# Patient Record
Sex: Male | Born: 1950 | Race: Black or African American | Hispanic: No | Smoking: Heavy tobacco smoker
Health system: Southern US, Community
[De-identification: ages and names within clinical notes are randomized; demographics above are authoritative.]

---

## 2001-12-28 ENCOUNTER — Emergency Department (HOSPITAL_COMMUNITY): Admission: EM | Admit: 2001-12-28 | Discharge: 2001-12-28 | Payer: Self-pay | Admitting: Emergency Medicine

## 2001-12-28 ENCOUNTER — Encounter: Payer: Self-pay | Admitting: Emergency Medicine

## 2002-11-28 ENCOUNTER — Encounter: Payer: Self-pay | Admitting: Emergency Medicine

## 2002-11-28 ENCOUNTER — Emergency Department (HOSPITAL_COMMUNITY): Admission: EM | Admit: 2002-11-28 | Discharge: 2002-11-28 | Payer: Self-pay | Admitting: Emergency Medicine

## 2010-11-22 ENCOUNTER — Emergency Department (HOSPITAL_COMMUNITY)
Admission: EM | Admit: 2010-11-22 | Discharge: 2010-11-22 | Discharge status: Against Medical Advice | Payer: Self-pay | Attending: Emergency Medicine | Admitting: Emergency Medicine

## 2010-11-22 DIAGNOSIS — S0993XA Unspecified injury of face, initial encounter: Secondary | ICD-10-CM | POA: Insufficient documentation

## 2018-09-17 ENCOUNTER — Inpatient Hospital Stay (HOSPITAL_COMMUNITY)
Admission: EM | Admit: 2018-09-17 | Discharge: 2018-10-27 | DRG: 189 | Disposition: E | Payer: Medicare Other | Attending: Internal Medicine | Admitting: Internal Medicine

## 2018-09-17 ENCOUNTER — Other Ambulatory Visit: Payer: Self-pay

## 2018-09-17 ENCOUNTER — Emergency Department (HOSPITAL_COMMUNITY): Payer: Medicare Other

## 2018-09-17 ENCOUNTER — Encounter (HOSPITAL_COMMUNITY): Payer: Self-pay | Admitting: Emergency Medicine

## 2018-09-17 DIAGNOSIS — R64 Cachexia: Secondary | ICD-10-CM | POA: Diagnosis not present

## 2018-09-17 DIAGNOSIS — F17210 Nicotine dependence, cigarettes, uncomplicated: Secondary | ICD-10-CM | POA: Diagnosis present

## 2018-09-17 DIAGNOSIS — R918 Other nonspecific abnormal finding of lung field: Secondary | ICD-10-CM | POA: Diagnosis present

## 2018-09-17 DIAGNOSIS — I493 Ventricular premature depolarization: Secondary | ICD-10-CM | POA: Diagnosis present

## 2018-09-17 DIAGNOSIS — F1010 Alcohol abuse, uncomplicated: Secondary | ICD-10-CM | POA: Diagnosis present

## 2018-09-17 DIAGNOSIS — E43 Unspecified severe protein-calorie malnutrition: Secondary | ICD-10-CM | POA: Diagnosis present

## 2018-09-17 DIAGNOSIS — Z66 Do not resuscitate: Secondary | ICD-10-CM | POA: Diagnosis present

## 2018-09-17 DIAGNOSIS — L89220 Pressure ulcer of left hip, unstageable: Secondary | ICD-10-CM | POA: Diagnosis present

## 2018-09-17 DIAGNOSIS — Z9119 Patient's noncompliance with other medical treatment and regimen: Secondary | ICD-10-CM

## 2018-09-17 DIAGNOSIS — C3401 Malignant neoplasm of right main bronchus: Secondary | ICD-10-CM | POA: Diagnosis present

## 2018-09-17 DIAGNOSIS — R042 Hemoptysis: Secondary | ICD-10-CM | POA: Diagnosis present

## 2018-09-17 DIAGNOSIS — J441 Chronic obstructive pulmonary disease with (acute) exacerbation: Secondary | ICD-10-CM | POA: Diagnosis not present

## 2018-09-17 DIAGNOSIS — J440 Chronic obstructive pulmonary disease with acute lower respiratory infection: Secondary | ICD-10-CM | POA: Diagnosis present

## 2018-09-17 DIAGNOSIS — Z681 Body mass index (BMI) 19 or less, adult: Secondary | ICD-10-CM

## 2018-09-17 DIAGNOSIS — Z720 Tobacco use: Secondary | ICD-10-CM | POA: Diagnosis not present

## 2018-09-17 DIAGNOSIS — Z515 Encounter for palliative care: Secondary | ICD-10-CM | POA: Diagnosis not present

## 2018-09-17 DIAGNOSIS — R600 Localized edema: Secondary | ICD-10-CM | POA: Diagnosis present

## 2018-09-17 DIAGNOSIS — Z5320 Procedure and treatment not carried out because of patient's decision for unspecified reasons: Secondary | ICD-10-CM | POA: Diagnosis present

## 2018-09-17 DIAGNOSIS — Z885 Allergy status to narcotic agent status: Secondary | ICD-10-CM

## 2018-09-17 DIAGNOSIS — I871 Compression of vein: Secondary | ICD-10-CM | POA: Diagnosis present

## 2018-09-17 DIAGNOSIS — R0602 Shortness of breath: Secondary | ICD-10-CM

## 2018-09-17 DIAGNOSIS — R062 Wheezing: Secondary | ICD-10-CM

## 2018-09-17 DIAGNOSIS — I5030 Unspecified diastolic (congestive) heart failure: Secondary | ICD-10-CM | POA: Diagnosis not present

## 2018-09-17 DIAGNOSIS — J9601 Acute respiratory failure with hypoxia: Secondary | ICD-10-CM | POA: Diagnosis not present

## 2018-09-17 DIAGNOSIS — J9690 Respiratory failure, unspecified, unspecified whether with hypoxia or hypercapnia: Secondary | ICD-10-CM

## 2018-09-17 DIAGNOSIS — R5381 Other malaise: Secondary | ICD-10-CM | POA: Diagnosis present

## 2018-09-17 DIAGNOSIS — Z7189 Other specified counseling: Secondary | ICD-10-CM | POA: Diagnosis not present

## 2018-09-17 DIAGNOSIS — J189 Pneumonia, unspecified organism: Secondary | ICD-10-CM | POA: Diagnosis not present

## 2018-09-17 DIAGNOSIS — Z833 Family history of diabetes mellitus: Secondary | ICD-10-CM | POA: Diagnosis not present

## 2018-09-17 DIAGNOSIS — R9431 Abnormal electrocardiogram [ECG] [EKG]: Secondary | ICD-10-CM | POA: Diagnosis present

## 2018-09-17 DIAGNOSIS — L8990 Pressure ulcer of unspecified site, unspecified stage: Secondary | ICD-10-CM

## 2018-09-17 DIAGNOSIS — Z20828 Contact with and (suspected) exposure to other viral communicable diseases: Secondary | ICD-10-CM | POA: Diagnosis present

## 2018-09-17 DIAGNOSIS — R0902 Hypoxemia: Secondary | ICD-10-CM

## 2018-09-17 LAB — BASIC METABOLIC PANEL
Anion gap: 14 (ref 5–15)
BUN: 6 mg/dL — ABNORMAL LOW (ref 8–23)
CO2: 25 mmol/L (ref 22–32)
Calcium: 10.8 mg/dL — ABNORMAL HIGH (ref 8.9–10.3)
Chloride: 99 mmol/L (ref 98–111)
Creatinine, Ser: 0.58 mg/dL — ABNORMAL LOW (ref 0.61–1.24)
GFR calc Af Amer: >60 mL/min (ref >60)
GFR calc non Af Amer: >60 mL/min (ref >60)
Glucose, Bld: 114 mg/dL — ABNORMAL HIGH (ref 70–99)
Potassium: 3.6 mmol/L (ref 3.5–5.1)
Sodium: 138 mmol/L (ref 135–145)

## 2018-09-17 LAB — CBC WITH DIFFERENTIAL/PLATELET
Abs Immature Granulocytes: 0.04 10*3/uL (ref 0.00–0.07)
Basophils Absolute: 0.1 10*3/uL (ref 0.0–0.1)
Basophils Relative: 0 %
Eosinophils Absolute: 0 10*3/uL (ref 0.0–0.5)
Eosinophils Relative: 0 %
HCT: 39.3 % (ref 39.0–52.0)
Hemoglobin: 12.5 g/dL — ABNORMAL LOW (ref 13.0–17.0)
Immature Granulocytes: 0 %
Lymphocytes Relative: 25 %
Lymphs Abs: 3.6 10*3/uL (ref 0.7–4.0)
MCH: 29.4 pg (ref 26.0–34.0)
MCHC: 31.8 g/dL (ref 30.0–36.0)
MCV: 92.5 fL (ref 80.0–100.0)
Monocytes Absolute: 1.1 10*3/uL — ABNORMAL HIGH (ref 0.1–1.0)
Monocytes Relative: 8 %
Neutro Abs: 9.4 10*3/uL — ABNORMAL HIGH (ref 1.7–7.7)
Neutrophils Relative %: 67 %
Platelets: 528 10*3/uL — ABNORMAL HIGH (ref 150–400)
RBC: 4.25 MIL/uL (ref 4.22–5.81)
RDW: 15.8 % — ABNORMAL HIGH (ref 11.5–15.5)
WBC: 14.2 10*3/uL — ABNORMAL HIGH (ref 4.0–10.5)
nRBC: 0 % (ref 0.0–0.2)

## 2018-09-17 LAB — TROPONIN I: Troponin I: <0.03 ng/mL (ref <0.03)

## 2018-09-17 LAB — SARS CORONAVIRUS 2 BY RT PCR (HOSPITAL ORDER, PERFORMED IN ~~LOC~~ HOSPITAL LAB): SARS Coronavirus 2: NEGATIVE

## 2018-09-17 MED ORDER — METHYLPREDNISOLONE SODIUM SUCC 40 MG IJ SOLR
40.0000 mg | Freq: Two times a day (BID) | INTRAMUSCULAR | Status: DC
  Filled 2018-09-17: qty 1

## 2018-09-17 MED ORDER — VITAMIN B-1 100 MG PO TABS
100.0000 mg | ORAL_TABLET | Freq: Every day | ORAL | Status: DC
  Administered 2018-09-18 – 2018-09-26 (×9): 100 mg via ORAL
  Filled 2018-09-17 (×9): qty 1

## 2018-09-17 MED ORDER — PREDNISONE 20 MG PO TABS: 40.0000 mg | ORAL_TABLET | Freq: Every day | ORAL | Status: DC

## 2018-09-17 MED ORDER — AZITHROMYCIN 250 MG PO TABS: 500.0000 mg | ORAL_TABLET | Freq: Once | ORAL | Status: DC

## 2018-09-17 MED ORDER — LEVALBUTEROL HCL 0.63 MG/3ML IN NEBU
0.6300 mg | INHALATION_SOLUTION | Freq: Four times a day (QID) | RESPIRATORY_TRACT | Status: DC | PRN
  Administered 2018-09-20 – 2018-09-25 (×6): 0.63 mg via RESPIRATORY_TRACT
  Filled 2018-09-17 (×9): qty 3

## 2018-09-17 MED ORDER — SODIUM CHLORIDE 0.9% FLUSH
3.0000 mL | Freq: Two times a day (BID) | INTRAVENOUS | Status: DC
  Administered 2018-09-21 – 2018-09-27 (×6): 3 mL via INTRAVENOUS

## 2018-09-17 MED ORDER — PREDNISONE 20 MG PO TABS
60.0000 mg | ORAL_TABLET | Freq: Once | ORAL | Status: AC
  Administered 2018-09-17: 13:00:00 60 mg via ORAL
  Filled 2018-09-17: qty 3

## 2018-09-17 MED ORDER — AMOXICILLIN 500 MG PO CAPS
500.0000 mg | ORAL_CAPSULE | Freq: Once | ORAL | Status: AC
  Administered 2018-09-17: 15:00:00 500 mg via ORAL
  Filled 2018-09-17: qty 1

## 2018-09-17 MED ORDER — IPRATROPIUM BROMIDE HFA 17 MCG/ACT IN AERS
2.0000 | INHALATION_SPRAY | Freq: Once | RESPIRATORY_TRACT | Status: AC
  Administered 2018-09-17: 2 via RESPIRATORY_TRACT
  Filled 2018-09-17: qty 12.9

## 2018-09-17 MED ORDER — ACETAMINOPHEN 325 MG PO TABS
650.0000 mg | ORAL_TABLET | Freq: Four times a day (QID) | ORAL | Status: DC | PRN
  Filled 2018-09-17 (×2): qty 2

## 2018-09-17 MED ORDER — FOLIC ACID 1 MG PO TABS
1.0000 mg | ORAL_TABLET | Freq: Every day | ORAL | Status: DC
  Administered 2018-09-18 – 2018-09-26 (×9): 1 mg via ORAL
  Filled 2018-09-17 (×9): qty 1

## 2018-09-17 MED ORDER — ACETAMINOPHEN 650 MG RE SUPP: 650.0000 mg | Freq: Four times a day (QID) | RECTAL | Status: DC | PRN

## 2018-09-17 MED ORDER — SODIUM CHLORIDE 0.9 % IV SOLN: 1.0000 g | Freq: Once | INTRAVENOUS | Status: DC

## 2018-09-17 MED ORDER — DOXYCYCLINE HYCLATE 100 MG PO TABS
100.0000 mg | ORAL_TABLET | Freq: Two times a day (BID) | ORAL | Status: DC
  Administered 2018-09-18 – 2018-09-20 (×5): 100 mg via ORAL
  Filled 2018-09-17 (×5): qty 1

## 2018-09-17 MED ORDER — NICOTINE 21 MG/24HR TD PT24
21.0000 mg | MEDICATED_PATCH | Freq: Every day | TRANSDERMAL | Status: DC
  Administered 2018-09-18 – 2018-09-21 (×4): 21 mg via TRANSDERMAL
  Filled 2018-09-17 (×5): qty 1

## 2018-09-17 MED ORDER — IPRATROPIUM-ALBUTEROL 0.5-2.5 (3) MG/3ML IN SOLN
3.0000 mL | Freq: Four times a day (QID) | RESPIRATORY_TRACT | Status: DC
  Administered 2018-09-17 – 2018-09-19 (×9): 3 mL via RESPIRATORY_TRACT
  Filled 2018-09-17 (×7): qty 3

## 2018-09-17 MED ORDER — SODIUM CHLORIDE 0.9 % IV SOLN
INTRAVENOUS | Status: DC
  Administered 2018-09-20 – 2018-09-24 (×3): via INTRAVENOUS

## 2018-09-17 MED ORDER — ALBUTEROL SULFATE HFA 108 (90 BASE) MCG/ACT IN AERS
2.0000 | INHALATION_SPRAY | Freq: Once | RESPIRATORY_TRACT | Status: AC
  Administered 2018-09-17: 2 via RESPIRATORY_TRACT

## 2018-09-17 MED ORDER — ALBUTEROL SULFATE (2.5 MG/3ML) 0.083% IN NEBU
2.5000 mg | INHALATION_SOLUTION | Freq: Once | RESPIRATORY_TRACT | Status: AC
  Administered 2018-09-17: 20:00:00 2.5 mg via RESPIRATORY_TRACT
  Filled 2018-09-17: qty 3

## 2018-09-17 MED ORDER — SODIUM CHLORIDE 0.9 % IV SOLN: 1.0000 g | INTRAVENOUS | Status: DC

## 2018-09-17 MED ORDER — AZITHROMYCIN 250 MG PO TABS
500.0000 mg | ORAL_TABLET | Freq: Once | ORAL | Status: AC
  Administered 2018-09-17: 15:00:00 500 mg via ORAL
  Filled 2018-09-17: qty 2

## 2018-09-17 MED ORDER — ENSURE ENLIVE PO LIQD: 237.0000 mL | Freq: Two times a day (BID) | ORAL | Status: DC

## 2018-09-17 MED ORDER — AEROCHAMBER PLUS FLO-VU MISC
1.0000 | Freq: Once | Status: AC
  Administered 2018-09-17: 1
  Filled 2018-09-17: qty 1

## 2018-09-17 MED ORDER — ALBUTEROL (5 MG/ML) CONTINUOUS INHALATION SOLN
10.0000 mg/h | INHALATION_SOLUTION | Freq: Once | RESPIRATORY_TRACT | Status: AC
  Administered 2018-09-17: 10 mg/h via RESPIRATORY_TRACT
  Filled 2018-09-17: qty 20

## 2018-09-17 MED ORDER — MOMETASONE FURO-FORMOTEROL FUM 200-5 MCG/ACT IN AERO
1.0000 | INHALATION_SPRAY | Freq: Two times a day (BID) | RESPIRATORY_TRACT | Status: DC
  Administered 2018-09-18 – 2018-09-27 (×19): 1 via RESPIRATORY_TRACT
  Filled 2018-09-17 (×2): qty 8.8

## 2018-09-17 MED ORDER — ALBUTEROL SULFATE HFA 108 (90 BASE) MCG/ACT IN AERS
2.0000 | INHALATION_SPRAY | Freq: Once | RESPIRATORY_TRACT | Status: AC
  Administered 2018-09-17: 13:00:00 2 via RESPIRATORY_TRACT
  Filled 2018-09-17: qty 6.7

## 2018-09-17 NOTE — ED Notes (Signed)
Pt refusing IV and IV antibiotics. Provider notified.

## 2018-09-17 NOTE — ED Provider Notes (Signed)
[facility] EMERGENCY DEPARTMENT Provider Note   CSN: [id number] Arrival date & time: 09/15/2018  1231    History   Chief Complaint Chief Complaint  Patient presents with  . Shortness of Breath    HPI Anthony Dorsey is a 68 y.o. male who presents with SOB. PMH significant for COPD, smoking (1.5 pack a day). He states that he has been having gradually worsening SOB and wheezing for about 3 weeks. Nothing makes it better or worse. He reports associated productive cough. He gets chest pain with coughing. He denies fever, chills. He does not come to the doctor typically. He reports a generally poor appetite and has lost weight recently.    HPI  History reviewed. No pertinent past medical history.  There are no active problems to display for this patient.   History reviewed. No pertinent surgical history.      Home Medications    Prior to Admission medications   Not on File    Family History No family history on file.  Social History Social History   Tobacco Use  . Smoking status: Heavy Tobacco Smoker    Packs/day: 1.00    Years: 40.00    Pack years: 40.00    Types: Cigarettes  . Smokeless tobacco: Never Used  Substance Use Topics  . Alcohol use: Yes    Comment: 1 qart/day  . Drug use: Not on file     Allergies   Codeine   Review of Systems Review of Systems  Constitutional: Positive for appetite change and unexpected weight change. Negative for fever.  HENT: Negative for congestion.   Respiratory: Positive for cough, shortness of breath and wheezing.   Cardiovascular: Positive for chest pain. Negative for palpitations and leg swelling.  Gastrointestinal: Negative for abdominal pain, nausea and vomiting.  Musculoskeletal: Negative for back pain.  Neurological: Negative for headaches.  All other systems reviewed and are negative.    Physical Exam Updated Vital Signs BP 123/83 (BP Location: Right Arm)   Pulse (!) 104   Temp  98 F (36.7 C) (Oral)   Resp 18   Ht 5\' 9"  (1.753 m)   Wt 63.5 kg   SpO2 96%   BMI 20.67 kg/m   Physical Exam Vitals signs and nursing note reviewed.  Constitutional:      General: He is not in acute distress.    Appearance: He is well-developed. He is ill-appearing (chronically ill appearing).     Comments: Calm, cooperative. Tachypneic but able to talk in full sentences.   HENT:     Head: Normocephalic and atraumatic.  Eyes:     General: No scleral icterus.       Right eye: No discharge.        Left eye: No discharge.     Conjunctiva/sclera: Conjunctivae normal.     Pupils: Pupils are equal, round, and reactive to light.  Neck:     Musculoskeletal: Normal range of motion.  Cardiovascular:     Rate and Rhythm: Normal rate and regular rhythm.  Pulmonary:     Effort: Pulmonary effort is normal. No respiratory distress.     Breath sounds: Wheezing (diffuse inspiratory and expiratory wheezes) present.  Abdominal:     General: There is no distension.     Palpations: Abdomen is soft.     Tenderness: There is no abdominal tenderness.  Musculoskeletal:     Right lower leg: No edema.     Left lower leg: No edema.  Skin:  General: Skin is warm and dry.  Neurological:     Mental Status: He is alert and oriented to person, place, and time.  Psychiatric:        Behavior: Behavior normal.      ED Treatments / Results  Labs (all labs ordered are listed, but only abnormal results are displayed) Labs Reviewed  BASIC METABOLIC PANEL - Abnormal; Notable for the following components:      Result Value   Glucose, Bld 114 (*)    BUN 6 (*)    Creatinine, Ser 0.58 (*)    Calcium 10.8 (*)    All other components within normal limits  CBC WITH DIFFERENTIAL/PLATELET - Abnormal; Notable for the following components:   WBC 14.2 (*)    Hemoglobin 12.5 (*)    RDW 15.8 (*)    Platelets 528 (*)    Neutro Abs 9.4 (*)    Monocytes Absolute 1.1 (*)    All other components within  normal limits  SARS CORONAVIRUS 2 (HOSPITAL ORDER, [facility])  TROPONIN I    EKG EKG Interpretation  Date/Time:  Friday Sep 17 2018 12:53:23 EDT Ventricular Rate:  104 PR Interval:    QRS Duration: 55 QT Interval:  400 QTC Calculation: 527 R Axis:   93 Text Interpretation:  Sinus tachycardia Ventricular premature complex Artifact T wave abnormality Abnormal ekg Confirmed by Carmin Muskrat [phone]) on 09/20/2018 1:03:42 PM   Radiology Dg Chest 2 View  Result Date: 09/12/2018 CLINICAL DATA:  Wheezing and shortness of breath EXAM: CHEST - 2 VIEW COMPARISON:  None. FINDINGS: The heart size and mediastinal contours are within normal limits. Emphysema. There is paramedian opacity and/or consolidation of the medial right upper lobe. Disc degenerative disease of the thoracic spine. IMPRESSION: 1. There is paramedian opacity and/or consolidation of the medial right upper lobe, concerning for mass or consolidation. Recommend CT to further evaluate. 2.  Emphysema. Electronically Signed   By: Eddie Candle M.D.   On: 09/22/2018 13:51    Procedures Procedures (including critical care time)  CRITICAL CARE Performed by: Recardo Evangelist   Total critical care time: 35 minutes  Critical care time was exclusive of separately billable procedures and treating other patients.  Critical care was necessary to treat or prevent imminent or life-threatening deterioration.  Critical care was time spent personally by me on the following activities: development of treatment plan with patient and/or surrogate as well as nursing, discussions with consultants, evaluation of patient's response to treatment, examination of patient, obtaining history from patient or surrogate, ordering and performing treatments and interventions, ordering and review of laboratory studies, ordering and review of radiographic studies, pulse oximetry and re-evaluation of patient's condition.    Medications Ordered in ED Medications  albuterol (VENTOLIN HFA) 108 (90 Base) MCG/ACT inhaler 2 puff (2 puffs Inhalation Given 08/28/2018 1311)  aerochamber plus with mask device 1 each (1 each Other Given 08/30/2018 1342)  predniSONE (DELTASONE) tablet 60 mg (60 mg Oral Given 09/26/2018 1311)  ipratropium (ATROVENT HFA) inhaler 2 puff (2 puffs Inhalation Given 09/04/2018 1342)  albuterol (VENTOLIN HFA) 108 (90 Base) MCG/ACT inhaler 2 puff (2 puffs Inhalation Given 08/28/2018 1428)  albuterol (PROVENTIL,VENTOLIN) solution continuous neb (10 mg/hr Nebulization Given 08/30/2018 1524)  amoxicillin (AMOXIL) capsule 500 mg (500 mg Oral Given 08/30/2018 1529)  azithromycin (ZITHROMAX) tablet 500 mg (500 mg Oral Given 09/10/2018 1529)     Initial Impression / Assessment and Plan / ED Course  I have reviewed  the triage vital signs and the nursing notes.  Pertinent labs & imaging results that were available during my care of the patient were reviewed by me and considered in my medical decision making (see chart for details).  68 year old male presents with SOB for 3 weeks with associated cough. He has diffuse wheezing on exam. Suspect COPD exacerbation although he states he has not been diagnosed with this. He is an active smoker. No fever or constant CP. Will obtain EKG, CXR, labs and give albuterol inhaler and prednisone.  EKG is sinus tachycardia with PVCs. CXR shows RUL opacity vs mass. COVID test was negative. Breathing was not improved after several inhaler tx. CBC is remarkable for leukocytosis (14). BMP is remarkable for mild hypercalcemia (10.8). Trop is normal. CT chest w contrast was ordered due to opacity but pt is refusing IV. He was given oral antibiotics and CAT tx was ordered. Care was signed out to L [employer]. I anticipate he will need admission but due to patient's non-compliance, he is refusing admission or IV currently. He will likely need to leave AMA if he is still not improved after tx and he is  refusing admission.  JARETT DRALLE was evaluated in Emergency Department on 09/05/2018 for the symptoms described in the history of present illness. He was evaluated in the context of the global COVID-19 pandemic, which necessitated consideration that the patient might be at risk for infection with the SARS-CoV-2 virus that causes COVID-19. Institutional protocols and algorithms that pertain to the evaluation of patients at risk for COVID-19 are in a state of rapid change based on information released by regulatory bodies including the CDC and federal and state organizations. These policies and algorithms were followed during the patient's care in the ED.   Final Clinical Impressions(s) / ED Diagnoses   Final diagnoses:  COPD exacerbation [facility])    ED Discharge Orders    None       Recardo Evangelist, PA-C 09/16/2018 1605    Carmin Muskrat, MD 09/21/18 [phone]

## 2018-09-17 NOTE — ED Triage Notes (Signed)
Pt arrives from home. Complaining of shortness of breath for the past month. Patient states that he just got tired of being short of breath and he came to get it checked out.

## 2018-09-17 NOTE — ED Notes (Signed)
Patient transported to CT 

## 2018-09-17 NOTE — ED Notes (Signed)
Pt arrived back from CT. Per transporter, pt was unable to tolerate CT scan, stating he could not breath laying flat on his back.

## 2018-09-17 NOTE — ED Notes (Signed)
ED TO INPATIENT HANDOFF REPORT  ED Nurse Name and Phone #: Alveta Heimlich [phone] S Name/Age/Gender Anthony Dorsey 68 y.o. male Room/Bed: 030C/030C  Code Status   Code Status: Not on file  Home/SNF/Other Home Patient oriented to: self, place, time and situation Is this baseline? Yes   Triage Complete: Triage complete  Chief Complaint breathing issues  Triage Note Pt arrives from home. Complaining of shortness of breath for the past month. Patient states that he just got tired of being short of breath and he came to get it checked out.   Allergies Allergies  Allergen Reactions  . Codeine Itching    Level of Care/Admitting Diagnosis ED Disposition    ED Disposition Condition [facility]: [facility] [[id number]]  Level of Care: Progressive [102]  Covid Evaluation: N/A  Diagnosis: COPD exacerbation ([place]) [[id number]]  Admitting Physician: Toy Baker [[id number]]  Attending Physician: Toy Baker [[id number]]  Estimated length of stay: 3 - 4 days  Certification:: I certify this patient will need inpatient services for at least 2 midnights  PT Class (Do Not Modify): Inpatient [101]  PT Acc Code (Do Not Modify): Private [1]       B Medical/Surgery History History reviewed. No pertinent past medical history. History reviewed. No pertinent surgical history.   A IV Location/Drains/Wounds Patient Lines/Drains/Airways Status   Active Line/Drains/Airways    None          Intake/Output Last 24 hours No intake or output data in the 24 hours ending 09/24/2018 2105  Labs/Imaging Results for orders placed or performed during the hospital encounter of 09/02/2018 (from the past 48 hour(s))  Basic metabolic panel     Status: Abnormal   Collection Time: 09/22/2018  1:05 PM  Result Value Ref Range   Sodium 138 135 - 145 mmol/L   Potassium 3.6 3.5 - 5.1 mmol/L   Chloride 99 98 - 111 mmol/L   CO2 25 22 - 32 mmol/L   Glucose, Bld 114 (H) 70 -  99 mg/dL   BUN 6 (L) 8 - 23 mg/dL   Creatinine, Ser 0.58 (L) 0.61 - 1.24 mg/dL   Calcium 10.8 (H) 8.9 - 10.3 mg/dL   GFR calc non Af Amer >60 >60 mL/min   GFR calc Af Amer >60 >60 mL/min   Anion gap 14 5 - 15    Comment: Performed at [facility], [place] [address] [place], [place] [address]  CBC with Differential     Status: Abnormal   Collection Time: 09/07/2018  1:05 PM  Result Value Ref Range   WBC 14.2 (H) 4.0 - 10.5 K/uL   RBC 4.25 4.22 - 5.81 MIL/uL   Hemoglobin 12.5 (L) 13.0 - 17.0 g/dL   HCT 39.3 39.0 - 52.0 %   MCV 92.5 80.0 - 100.0 fL   MCH 29.4 26.0 - 34.0 pg   MCHC 31.8 30.0 - 36.0 g/dL   RDW 15.8 (H) 11.5 - 15.5 %   Platelets 528 (H) 150 - 400 K/uL   nRBC 0.0 0.0 - 0.2 %   Neutrophils Relative % 67 %   Neutro Abs 9.4 (H) 1.7 - 7.7 K/uL   Lymphocytes Relative 25 %   Lymphs Abs 3.6 0.7 - 4.0 K/uL   Monocytes Relative 8 %   Monocytes Absolute 1.1 (H) 0.1 - 1.0 K/uL   Eosinophils Relative 0 %   Eosinophils Absolute 0.0 0.0 - 0.5 K/uL   Basophils Relative 0 %  Basophils Absolute 0.1 0.0 - 0.1 K/uL   Immature Granulocytes 0 %   Abs Immature Granulocytes 0.04 0.00 - 0.07 K/uL    Comment: Performed at [facility], [place] [address], [place], [place] [address]  Troponin I - Once     Status: None   Collection Time: 09/25/2018  1:05 PM  Result Value Ref Range   Troponin I <0.03 <0.03 ng/mL    Comment: Performed at [facility] [address], [place], [place] [address]  SARS Coronavirus 2 (CEPHEID- Performed in [facility] hospital lab), Hosp Order     Status: None   Collection Time: 09/11/2018  1:17 PM  Result Value Ref Range   SARS Coronavirus 2 NEGATIVE NEGATIVE    Comment: (NOTE) If result is NEGATIVE SARS-CoV-2 target nucleic acids are NOT DETECTED. The SARS-CoV-2 RNA is generally detectable in upper and lower  respiratory specimens during the acute phase of infection. The lowest  concentration of SARS-CoV-2 viral copies this assay can detect is 250   copies / mL. A negative result does not preclude SARS-CoV-2 infection  and should not be used as the sole basis for treatment or other  patient management decisions.  A negative result may occur with  improper specimen collection / handling, submission of specimen other  than nasopharyngeal swab, presence of viral mutation(s) within the  areas targeted by this assay, and inadequate number of viral copies  (<250 copies / mL). A negative result must be combined with clinical  observations, patient history, and epidemiological information. If result is POSITIVE SARS-CoV-2 target nucleic acids are DETECTED. The SARS-CoV-2 RNA is generally detectable in upper and lower  respiratory specimens dur ing the acute phase of infection.  Positive  results are indicative of active infection with SARS-CoV-2.  Clinical  correlation with patient history and other diagnostic information is  necessary to determine patient infection status.  Positive results do  not rule out bacterial infection or co-infection with other viruses. If result is PRESUMPTIVE POSTIVE SARS-CoV-2 nucleic acids MAY BE PRESENT.   A presumptive positive result was obtained on the submitted specimen  and confirmed on repeat testing.  While 2019 novel coronavirus  (SARS-CoV-2) nucleic acids may be present in the submitted sample  additional confirmatory testing may be necessary for epidemiological  and / or clinical management purposes  to differentiate between  SARS-CoV-2 and other Sarbecovirus currently known to infect humans.  If clinically indicated additional testing with an alternate test  methodology [phone]) is advised. The SARS-CoV-2 RNA is generally  detectable in upper and lower respiratory sp ecimens during the acute  phase of infection. The expected result is Negative. Fact Sheet for Patients:  StrictlyIdeas.no Fact Sheet for Healthcare  Providers: BankingDealers.co.za This test is not yet approved or cleared by the [place] FDA and has been authorized for detection and/or diagnosis of SARS-CoV-2 by FDA under an Emergency Use Authorization (EUA).  This EUA will remain in effect (meaning this test can be used) for the duration of the COVID-19 declaration under Section 564(b)(1) of the Act, 21 U.S.C. section 360bbb-3(b)(1), unless the authorization is terminated or revoked sooner. Performed at [facility], [place] [address], [place], [place] [address]    Dg Chest 2 View  Result Date: 09/18/2018 CLINICAL DATA:  Wheezing and shortness of breath EXAM: CHEST - 2 VIEW COMPARISON:  None. FINDINGS: The heart size and mediastinal contours are within normal limits. Emphysema. There is paramedian opacity and/or consolidation of the medial  right upper lobe. Disc degenerative disease of the thoracic spine. IMPRESSION: 1. There is paramedian opacity and/or consolidation of the medial right upper lobe, concerning for mass or consolidation. Recommend CT to further evaluate. 2.  Emphysema. Electronically Signed   By: Eddie Candle M.D.   On: 08/29/2018 13:51    Pending Labs Unresulted Labs (From admission, onward)    Start     Ordered   09/07/2018 1918  Phosphorus  Add-on,   R     09/19/2018 1917   09/05/2018 1909  Hepatic function panel  Add-on,   R     09/05/2018 1908   09/03/2018 1909  Troponin I - Now Then Q6H  Now then every 6 hours,   R     09/07/2018 1908   08/28/2018 1908  D-dimer, quantitative (not at [facility])  Add-on,   R     08/29/2018 1907   08/30/2018 1908  Parathyroid hormone, intact (no Ca)  Add-on,   R     09/12/2018 1907   Signed and Held  Magnesium  Add-on,   R     Signed and Held   Signed and Held  HIV antibody  Tomorrow morning,   R     Signed and Held   Signed and Held  Urinalysis, Routine w reflex microscopic  Once,   R     Signed and Held   Signed and Held  Culture, blood (routine x 2) Call MD if  unable to obtain prior to antibiotics being given  BLOOD CULTURE X 2,   R    Comments:  If blood cultures drawn in Emergency Department - Do not draw and cancel order   Question:  Patient immune status  Answer:  Normal   Signed and Held   Signed and Held  Culture, sputum-assessment  Once,   R    Question:  Patient immune status  Answer:  Normal   Signed and Held   Signed and Held  Comprehensive metabolic panel  Daily,   R     Signed and Held   Signed and Held  CBC WITH DIFFERENTIAL  Daily,   R     Signed and Held          Vitals/Pain Today's Vitals   09/16/2018 1500 09/18/2018 1524 09/13/2018 1600 09/02/2018 1631  BP: 118/87  108/74 99/66  Pulse: 93 91 (!) 103 (!) 107  Resp: 19 15 (!) 24 20  Temp:      TempSrc:      SpO2: 96% 98% 99% (!) 89%  Weight:      Height:      PainSc:        Isolation Precautions Droplet and Contact precautions  Medications Medications  albuterol (VENTOLIN HFA) 108 (90 Base) MCG/ACT inhaler 2 puff (2 puffs Inhalation Given 09/20/2018 1311)  aerochamber plus with mask device 1 each (1 each Other Given 09/15/2018 1342)  predniSONE (DELTASONE) tablet 60 mg (60 mg Oral Given 09/18/2018 1311)  ipratropium (ATROVENT HFA) inhaler 2 puff (2 puffs Inhalation Given 09/10/2018 1342)  albuterol (VENTOLIN HFA) 108 (90 Base) MCG/ACT inhaler 2 puff (2 puffs Inhalation Given 09/09/2018 1428)  albuterol (PROVENTIL,VENTOLIN) solution continuous neb (10 mg/hr Nebulization Given 09/20/2018 1524)  amoxicillin (AMOXIL) capsule 500 mg (500 mg Oral Given 09/07/2018 1529)  azithromycin (ZITHROMAX) tablet 500 mg (500 mg Oral Given 08/28/2018 1529)  albuterol (PROVENTIL) (2.5 MG/3ML) 0.083% nebulizer solution 2.5 mg (2.5 mg Nebulization Given 09/04/2018 2012)    Mobility walks Low fall risk  Focused Assessments Pulmonary Assessment Handoff:  Lung sounds: Bilateral Breath Sounds: Expiratory wheezes, Coarse crackles L Breath Sounds: Expiratory wheezes R Breath Sounds: Expiratory wheezes O2 Device:  Room Air        R Recommendations: See Admitting Provider Note  Report given to:   Additional Notes:

## 2018-09-17 NOTE — ED Provider Notes (Signed)
Care assumed from Janetta Hora, PA-C at shift change with CT chest pending.    In brief this is a 68 year old male who presents for evaluation of 3 weeks of progressive worsening shortness of breath, cough.  He is a smoker he smokes 1.5 packs of cigarettes a day.  He comes in for evaluation of worsening shortness of breath and wheezing.  He states that there are no alleviating or aggravating factors.  He also reports a productive cough.  No fevers.  Please see note from previous provider for full history/physical exam.  Physical Exam  BP 107/66   Pulse 91   Temp 97.7 F (36.5 C) (Oral)   Resp 17   Ht 5\' 9"  (1.753 m)   Wt 63.5 kg   SpO2 99%   BMI 20.67 kg/m   Physical Exam   Audible wheezing noted throughout.  Patient is tachypneic and tachycardic.  Wheezing noted throughout all lung fields.  ED Course/Procedures     Procedures  MDM  Chest x-ray that showed evidence of consolidation noted to the medial right upper lobe concerning for mass versus consolidation.  BMP is unremarkable.  CBC shows leukocytosis of 14.2.  COVID is negative.  Patient received albuterol inhalers, oral prednisone, oral antibiotics.  He had refused any IV medications.  Additionally, patient was on a continuous neb.  He is pending CT chest for further evaluation of abnormality seen on chest x-ray.  Received call from RN who stated that patient was not able to tolerate/cooperate CT chest.  Patient states that laying flat causes breathing to worsen and he refused to continue the CT chest.  My reevaluation of the patient, he was hypoxic with O2 sats in 89%.  He was placed on 2 L nasal cannula which bumped him up into the 90s.  Additionally, he continues to have audible wheezing, wheezing throughout all lung fields after continuous neb.  I discussed with patient regarding his difficulty breathing.  At this time, given hypoxia as well as wheezing, feel that he needs admission.  Discussed patient with Dr. Roel Cluck  (hospitalist). Plan for admission.   Portions of this note were generated with [employer]. Dictation errors may occur despite best attempts at proofreading.    1. COPD exacerbation ([place])       Desma Mcgregor 09/26/2018 2319    Duffy Bruce, MD 09/20/18 1015

## 2018-09-17 NOTE — Progress Notes (Signed)
Pt refusing any needle sticks. Pt has no IV access, will not allow lab draws as well. Patient stated that "he doesn't have to do anything he doesn't want to do". Pt was advised that he is very sick and needs IV antibiotics and fluid, pt still refusing. Will continue to encourage pt to allow treatment of care.

## 2018-09-17 NOTE — Consult Note (Signed)
NAME:  Anthony Dorsey, MRN:  [id number], DOB:  12/22/1950, LOS: 0 ADMISSION DATE:  09/26/2018, CONSULTATION DATE:  09/01/2018 REFERRING MD:  , CHIEF COMPLAINT:  Hemoptysis, Shortness of breath  Brief History   Mr. Anthony Dorsey is a 68 year old gentleman with history of tobacco abuse who presents with progressive shortness of breath and hemoptysis.  History of present illness   Mr. Anthony Dorsey is a 69 year old gentleman with history of heavy tobacco abuse who we are consulted to evaluate for hemoptysis.  Patient states that about 3 to 4 weeks ago he developed hemoptysis.  He states that he still coughing and coughing up a lot of blood at that time.  He states that over the next 3 to 4 weeks his hemoptysis is actually gotten better.  He states that now he is coughing blood mixed with sputum about once a day.  Does about a cup at bedside with clear sputum mixed with some blood.  He denies taking any anticoagulation.  He denies ever having symptoms like this before.  He denies bleeding from anywhere else.  He has no personal history of family history of lung cancer.  He denies chest pain, fever or chills.  He states that over the past 3 to 4 weeks he has developed progressive shortness of breath.  He states that he shortness of breath is worse with exertion not present at rest.  He denies orthopnea PND.  Denies lower extremity swelling.  He states that he presented to the hospital today due to progressive shortness of breath.  He has significant smoking history.  He is a current smoker. He  smokes a pack a day and has been smoking for the past 50 years. He endorses 25lb unintentional weight loss over the past 6 weeks. He denies night sweats.  Past Medical History  -Tobacco abuse   [facility] admission  Consults:  -PCCM  Procedures:  -None   Significant Diagnostic Tests:  CXR-  Micro Data:  -None  Antimicrobials:  -Amoxicillin and azithromycin given in ED  -Ceftriaxone and doxycycline ordered   Objective   Blood pressure 107/66, pulse 92, temperature 98 F (36.7 C), temperature source Oral, resp. rate 18, height 5\' 9"  (1.753 m), weight 63.5 kg, SpO2 100 %.       No intake or output data in the 24 hours ending 09/14/2018 2220 Filed Weights   09/19/2018 1245  Weight: 63.5 kg    Examination: General: Cachectic, on oxygen HENT: Adentulous Lungs: Bilateral scattered inspiratory and expiratory wheezes Cardiovascular: Heart sounds normal Abdomen: Soft, no organomegaly Extremities: Dry skin, no deformities Neuro: No focal neurological deficits   Assessment & Plan:  #Hemoptysis #Progressive Dyspnea #Tobacco abuse #Weight loss Patient's constellation of symptoms in the setting of tobacco abuse is most concerning for malignancy. He has no infectious symptoms. His hemoptysis is not massive and improving per the patient. CXR concerning for possible RUL mass. He is hemodynamically stable -obtain CT chest with contrast to evaluate further -Depending on findings of CT might need bronchoscopy for biopsy vs other biopsy modality ?TTFNA -If massive hemoptysis place RIGHT lung down -Stat IR consult if he develops massive hemoptysis -Hold all anticoagulation  Thanks for this consult and for the opportunity to participate in the care of your patient.  PCCM will continue to follow along with you    Labs   CBC: Recent Labs  Lab 08/27/2018 1305  WBC 14.2*  NEUTROABS 9.4*  HGB 12.5*  HCT 39.3  MCV 92.5  PLT 528*    Basic Metabolic Panel: Recent Labs  Lab 09/04/2018 1305  NA 138  K 3.6  CL 99  CO2 25  GLUCOSE 114*  BUN 6*  CREATININE 0.58*  CALCIUM 10.8*   GFR: Estimated Creatinine Clearance: 82.7 mL/min (A) (by C-G formula based on SCr of 0.58 mg/dL (L)). Recent Labs  Lab 09/22/2018 1305  WBC 14.2*    Cardiac Enzymes: Recent Labs  Lab 09/03/2018 1305  TROPONINI <0.03    HbA1C: No results found for: HGBA1C  CBG: No results  for input(s): GLUCAP in the last 168 hours.  Review of Systems:   Review of Systems  Constitutional: Positive for weight loss. Negative for chills and fever.  Eyes: Negative for blurred vision.  Respiratory: Positive for cough, hemoptysis, sputum production and shortness of breath.   Cardiovascular: Negative for chest pain and palpitations.  Gastrointestinal: Negative for nausea and vomiting.  Genitourinary: Negative for dysuria and urgency.  Skin: Negative for rash.  Neurological: Negative for headaches.    Past Medical History  He,  has no past medical history on file.   Surgical History   History reviewed. No pertinent surgical history.   Social History   reports that he has been smoking cigarettes. He has a 40.00 pack-year smoking history. He has never used smokeless tobacco. He reports current alcohol use.   Family History   His family history includes Diabetes in his father and mother.   Allergies Allergies  Allergen Reactions  . Codeine Itching     Home Medications  Prior to Admission medications   Medication Sig Start Date End Date Taking? Authorizing Provider  Budesonide (PULMICORT IN) Inhale 1 vial into the lungs 2 (two) times a day.   Yes [provider]     Consult time: 59 Minutes

## 2018-09-17 NOTE — ED Notes (Signed)
Anthony Dorsey (daughter), [phone]

## 2018-09-17 NOTE — H&P (Signed)
Anthony Dorsey YOM:[id number] DOB: May 06, 1950 DOA: 09/03/2018     PCP: Patient, No Pcp Per   Outpatient Specialists:  nONE    Patient arrived to ER on 09/15/2018 at 1231  Patient coming from: home Lives alone,     Chief Complaint:  Chief Complaint  Patient presents with  . Shortness of Breath    HPI: Anthony Dorsey is a 68 y.o. male with medical history significant of tobacco abuse    Presented with  1 month of worsening dyspnea, he got tired of feeling like that and came in to ER. Reports wheezing ongoing tobacco abuse 1.5 pack/day. Symptom onset gradual nothing seems to make it better reports productive cough and occasional chest pain while coughing but no chest pain at baseline.  No fevers or chills. Decreased appetite and severe weight loss Occasional hemoptysis Coarse breathing and stridor Change in voice States currently not on any medications  Infectious risk factors:  Reports shortness of breath  cough,   In RAPID COVID TEST NEGATIVE     Regarding pertinent Chronic problems:   Does not see doctor often  While in ER: Noted to be hypoxic on room air down to 80s Continue stabilizer x2 improvement suspect to have COPD exacerbation chest x-ray worrisome for infiltrate/possible mass patient could not tolerate CTA could not lay down flat due to shortness of breath Initially refusing IV access The following Work up has been ordered so far:  Orders Placed This Encounter  Procedures  . SARS Coronavirus 2 (CEPHEID- Performed in [place] hospital lab), [facility]  . DG Chest 2 View  . Basic metabolic panel  . CBC with Differential  . Troponin I - Once  . D-dimer, quantitative (not at [facility])  . Parathyroid hormone, intact (no Ca)  . Hepatic function panel  . Troponin I - Now Then Q6H  . Phosphorus  . Cardiac monitoring  . Consult to hospitalist  . Droplet and Contact precautions  . EKG 12-Lead  . EKG 12-Lead  . Admit to Inpatient (patient's expected  length of stay will be greater than 2 midnights or inpatient only procedure)     Following Medications were ordered in ER: Medications  albuterol (VENTOLIN HFA) 108 (90 Base) MCG/ACT inhaler 2 puff (2 puffs Inhalation Given 09/04/2018 1311)  aerochamber plus with mask device 1 each (1 each Other Given 09/26/2018 1342)  predniSONE (DELTASONE) tablet 60 mg (60 mg Oral Given 09/16/2018 1311)  ipratropium (ATROVENT HFA) inhaler 2 puff (2 puffs Inhalation Given 09/15/2018 1342)  albuterol (VENTOLIN HFA) 108 (90 Base) MCG/ACT inhaler 2 puff (2 puffs Inhalation Given 09/19/2018 1428)  albuterol (PROVENTIL,VENTOLIN) solution continuous neb (10 mg/hr Nebulization Given 09/25/2018 1524)  amoxicillin (AMOXIL) capsule 500 mg (500 mg Oral Given 09/07/2018 1529)  azithromycin (ZITHROMAX) tablet 500 mg (500 mg Oral Given 09/20/2018 1529)  albuterol (PROVENTIL) (2.5 MG/3ML) 0.083% nebulizer solution 2.5 mg (2.5 mg Nebulization Given 08/27/2018 2012)        Consult Orders  (From admission, onward)         Start     Ordered   09/01/2018 1750  Consult to hospitalist  Once    Provider:  Toy Baker, MD  Question Answer Comment  Place call to: [facility]   Reason for Consult Admit      08/30/2018 1749            Significant initial  Findings: Abnormal Labs Reviewed  BASIC METABOLIC PANEL - Abnormal; Notable for the following components:  Result Value   Glucose, Bld 114 (*)    BUN 6 (*)    Creatinine, Ser 0.58 (*)    Calcium 10.8 (*)    All other components within normal limits  CBC WITH DIFFERENTIAL/PLATELET - Abnormal; Notable for the following components:   WBC 14.2 (*)    Hemoglobin 12.5 (*)    RDW 15.8 (*)    Platelets 528 (*)    Neutro Abs 9.4 (*)    Monocytes Absolute 1.1 (*)    All other components within normal limits     Otherwise labs showing:    Recent Labs  Lab 09/01/2018 1305  NA 138  K 3.6  CO2 25  GLUCOSE 114*  BUN 6*  CREATININE 0.58*  CALCIUM 10.8*    Cr  stable,    Lab Results  Component Value Date   CREATININE 0.58 (L) 09/05/2018    No results for input(s): AST, ALT, ALKPHOS, BILITOT, PROT, ALBUMIN in the last 168 hours. Lab Results  Component Value Date   CALCIUM 10.8 (H) 09/06/2018     WBC      Component Value Date/Time   WBC 14.2 (H) 09/16/2018 1305   ANC    Component Value Date/Time   NEUTROABS 9.4 (H) 09/26/2018 1305     Plt: Lab Results  Component Value Date   PLT 528 (H) 09/03/2018      VBG ordered HG/HCT  stable,      Component Value Date/Time   HGB 12.5 (L) 09/20/2018 1305   HCT 39.3 09/13/2018 1305      Troponin  Cardiac Panel (last 3 results) Recent Labs    09/16/2018 1305  TROPONINI <0.03        UA  ordered        CXR- consolidation of the medial right upper lobe    ECG:  Personally reviewed by me showing: HR : 102 Rhythm:  Sinus tachycardia     no evidence of ischemic changes QTC 615     ED Triage Vitals  Enc Vitals Group     BP 09/06/2018 1250 123/83     Pulse Rate 08/28/2018 1250 (!) 104     Resp 09/05/2018 1250 18     Temp 09/07/2018 1250 98 F (36.7 C)     Temp Source 09/09/2018 1250 Oral     SpO2 09/13/2018 1250 96 %     Weight 09/16/2018 1245 140 lb (63.5 kg)     Height 09/21/2018 1245 5\' 9"  (1.753 m)     Head Circumference --      Peak Flow --      Pain Score 09/25/2018 1245 0     Pain Loc --      Pain Edu? --      Excl. in [place]? --   TMAX(24)@       Latest  Blood pressure 99/66, pulse (!) 107, temperature 98 F (36.7 C), temperature source Oral, resp. rate 20, height 5\' 9"  (1.753 m), weight 63.5 kg, SpO2 (!) 89 %.    Hospitalist was called for admission for COPD exacerbation   Review of Systems:    Pertinent positives include: fatigue, weight loss shortness of breath at rest. dyspnea on exertion chest pain, productive cough,   Constitutional:  No weight loss, night sweats, Fevers, chills,  HEENT:  No headaches, Difficulty swallowing,Tooth/dental problems,Sore throat,  No sneezing,  itching, ear ache, nasal congestion, post nasal drip,  Cardio-vascular:  No  Orthopnea, PND, anasarca, dizziness, palpitations.no Bilateral lower extremity swelling  GI:  No heartburn, indigestion, abdominal pain, nausea, vomiting, diarrhea, change in bowel habits, loss of appetite, melena, blood in stool, hematemesis Resp:  no , No excess mucus, no No non-productive cough, No coughing up of blood.No change in color of mucus.No wheezing. Skin:  no rash or lesions. No jaundice GU:  no dysuria, change in color of urine, no urgency or frequency. No straining to urinate.  No flank pain.  Musculoskeletal:  No joint pain or no joint swelling. No decreased range of motion. No back pain.  Psych:  No change in mood or affect. No depression or anxiety. No memory loss.  Neuro: no localizing neurological complaints, no tingling, no weakness, no double vision, no gait abnormality, no slurred speech, no confusion  All systems reviewed and apart from [place] all are negative  Past Medical History:  History reviewed. No pertinent past medical history.    History reviewed. No pertinent surgical history.  Social History:  Ambulatory   Independently     reports that he has been smoking cigarettes. He has a 40.00 pack-year smoking history. He has never used smokeless tobacco. He reports current alcohol use. No history on file for drug.     Family History:   Family History  Problem Relation Age of Onset  . Diabetes Mother   . Diabetes Father     Allergies: Allergies  Allergen Reactions  . Codeine Itching     Prior to Admission medications   Not on File   Physical Exam: Blood pressure 99/66, pulse (!) 107, temperature 98 F (36.7 C), temperature source Oral, resp. rate 20, height 5\' 9"  (1.753 m), weight 63.5 kg, SpO2 (!) 89 %. 1. General:  in  Acute distress increased work of breathing   Chronically ill  -appearing 2. Psychological: Alert and  Oriented 3. Head/ENT:   Dry Mucous  Membranes                          Head Non traumatic, neck supple                            Poor Dentition 4. SKIN: decreased Skin turgor,  Skin clean Dry and intact no rash 5. Heart: Regular rate and rhythm no  Murmur, no Rub or gallop 6. Lungs: Inspiratory and expiratory wheezes some crackles   7. Abdomen: Soft,  non-tender, Non distended thin bowel sounds present 8. Lower extremities: no clubbing, cyanosis, no  edema 9. Neurologically Grossly intact, moving all 4 extremities equally   10. MSK: Normal range of motion   All other LABS:     Recent Labs  Lab 09/24/2018 1305  WBC 14.2*  NEUTROABS 9.4*  HGB 12.5*  HCT 39.3  MCV 92.5  PLT 528*     Recent Labs  Lab 08/30/2018 1305  NA 138  K 3.6  CL 99  CO2 25  GLUCOSE 114*  BUN 6*  CREATININE 0.58*  CALCIUM 10.8*     No results for input(s): AST, ALT, ALKPHOS, BILITOT, PROT, ALBUMIN in the last 168 hours.     Cultures: No results found for: SDES, SPECREQUEST, CULT, REPTSTATUS   Radiological Exams on Admission: Dg Chest 2 View  Result Date: 09/24/2018 CLINICAL DATA:  Wheezing and shortness of breath EXAM: CHEST - 2 VIEW COMPARISON:  None. FINDINGS: The heart size and mediastinal contours are within normal limits. Emphysema. There is paramedian opacity and/or consolidation of the medial  right upper lobe. Disc degenerative disease of the thoracic spine. IMPRESSION: 1. There is paramedian opacity and/or consolidation of the medial right upper lobe, concerning for mass or consolidation. Recommend CT to further evaluate. 2.  Emphysema. Electronically Signed   By: Eddie Candle M.D.   On: 09/07/2018 13:51    Chart has been reviewed    Assessment/Plan  68 y.o. male with medical history significant of tobacco abuse    Admitted for COPD exacerbation hemoptysis and possible lung mass  Present on Admission: . COPD with acute exacerbation ([place]) -   Will initiate: Steroid taper  -  Antibiotics antibiotics Rocephin and doxy  -   XopenexPRN, - scheduled duoneb,  -  Breo or Dulera at discharge   -  Mucinex.  Titrate O2 to saturation >90%. Follow patients respiratory status.  Order  VBG  -  PCCM consulted for e-link monitoring and AM consult,   Currently mentating well no evidence of symptomatic hypercarbia  . Tobacco abuse -  - Spoke about importance of quitting spent 5 minutes discussing options for treatment, prior attempts at quitting, and dangers of smoking  -At this point patient is     interested in quitting  - order nicotine patch   - nursing tobacco cessation protocol  . Acute respiratory failure with hypoxia ([place]) - admit to stepdown due to increase work of breathing, benefit from additional imaging . Hypercalcemia - mild secondary to dehydration but will check PTH, given extensive weight loss would also benefit from additional work-up for malignancy . Lung mass -vs  pneumonia will benefit from additional imaging at this point unable to tolerate CT secondary to increased work of breathing afebrile But for now add community acquired pneumonia coverage given leukocytosis . Prolonged QT interval - - will monitor on tele avoid QT prolonging medications, rehydrate correct electrolytes    . Alcohol abuse - CIWA protocol no evidence of withdrawal  hemoptysis-  would benefit from for further work-up such as additional imaging to rule out mass  Other plan as per orders.  DVT prophylaxis:    SCD    Code Status:   DNR/DNI   as per patient   I had personally discussed CODE STATUS with patient    Family Communication:   Family not at  Bedside    Disposition Plan:       To home once workup is complete and patient is stable                  Nutrition    consulted                 Consults called: pulmonary    Admission status:  ED Disposition    ED Disposition Condition [place]: [place] [[id number]]  Level of Care: Progressive [102]  Covid Evaluation: N/A   Diagnosis: COPD exacerbation ([place]) [132440]  Admitting Physician: Toy Baker [[id number]]  Attending Physician: Toy Baker [[id number]]  Estimated length of stay: 3 - 4 days  Certification:: I certify this patient will need inpatient services for at least 2 midnights  PT Class (Do Not Modify): Inpatient [101]  PT Acc Code (Do Not Modify): Private [1]         inpatient     Expect 2 midnight stay secondary to severity of patient's current illness including   hemodynamic instability despite optimal treatment (tachycardia  hypoxia,)   Severe lab/radiological/exam abnormalities including:  Mass vs PNA   and extensive  comorbidities including:  substance abuse  .    That are currently affecting medical management.   I expect  patient to be hospitalized for 2 midnights requiring inpatient medical care.  Patient is at high risk for adverse outcome (such as loss of life or disability) if not treated.  Indication for inpatient stay as follows:  Severe change from baseline regarding mental status Hemodynamic instability despite maximal medical therapy,  ongoing suicidal ideations,  severe pain requiring acute inpatient management,  inability to maintain oral hydration   persistent chest pain despite medical management   New or worsening hypoxia  Need for IV antibiotics, IV fluids, IV rate controling medications, IV antihypertensives, IV pain medications     Level of care    SDU tele indefinitely please discontinue once patient no longer qualifies  Precautions:  NONE    PPE: Used by the provider:   P100  eye Goggles,  Gloves     Anel Purohit 09/20/2018, 9:21 PM    [facility]     after 2 AM please page floor coverage PA If 7AM-7PM, please contact the day team taking care of the patient using [email or web address]

## 2018-09-18 ENCOUNTER — Inpatient Hospital Stay: Payer: Self-pay

## 2018-09-18 LAB — CBC WITH DIFFERENTIAL/PLATELET
Abs Immature Granulocytes: 0.05 10*3/uL (ref 0.00–0.07)
Basophils Absolute: 0 10*3/uL (ref 0.0–0.1)
Basophils Relative: 0 %
Eosinophils Absolute: 0 10*3/uL (ref 0.0–0.5)
Eosinophils Relative: 0 %
HCT: 37.2 % — ABNORMAL LOW (ref 39.0–52.0)
Hemoglobin: 12.2 g/dL — ABNORMAL LOW (ref 13.0–17.0)
Immature Granulocytes: 0 %
Lymphocytes Relative: 29 %
Lymphs Abs: 3.7 10*3/uL (ref 0.7–4.0)
MCH: 30.3 pg (ref 26.0–34.0)
MCHC: 32.8 g/dL (ref 30.0–36.0)
MCV: 92.3 fL (ref 80.0–100.0)
Monocytes Absolute: 1.2 10*3/uL — ABNORMAL HIGH (ref 0.1–1.0)
Monocytes Relative: 10 %
Neutro Abs: 7.8 10*3/uL — ABNORMAL HIGH (ref 1.7–7.7)
Neutrophils Relative %: 61 %
Platelets: ADEQUATE 10*3/uL (ref 150–400)
RBC: 4.03 MIL/uL — ABNORMAL LOW (ref 4.22–5.81)
RDW: 17.1 % — ABNORMAL HIGH (ref 11.5–15.5)
WBC: 12.8 10*3/uL — ABNORMAL HIGH (ref 4.0–10.5)
nRBC: 0 % (ref 0.0–0.2)

## 2018-09-18 LAB — MRSA PCR SCREENING: MRSA by PCR: NEGATIVE

## 2018-09-18 MED ORDER — ENSURE ENLIVE PO LIQD
237.0000 mL | Freq: Three times a day (TID) | ORAL | Status: DC
  Administered 2018-09-18 – 2018-09-26 (×24): 237 mL via ORAL

## 2018-09-18 MED ORDER — METHYLPREDNISOLONE SODIUM SUCC 40 MG IJ SOLR: 40.0000 mg | Freq: Two times a day (BID) | INTRAMUSCULAR | Status: DC

## 2018-09-18 MED ORDER — ADULT MULTIVITAMIN W/MINERALS CH
1.0000 | ORAL_TABLET | Freq: Every day | ORAL | Status: DC
  Administered 2018-09-18 – 2018-09-26 (×9): 1 via ORAL
  Filled 2018-09-18 (×9): qty 1

## 2018-09-18 NOTE — Progress Notes (Signed)
Initial Nutrition Assessment  RD working remotely.  DOCUMENTATION CODES:   Underweight, suspect some degree of malnutrition but unable to confirm at this time without NFPE or a more detailed diet and weight history  INTERVENTION:   - Ensure Enlive po TID, each supplement provides 350 kcal and 20 grams of protein  - MVI with minerals daily  - Will attach "High-Calorie, High-Protein Nutrition Therapy" handout from the Academy of [employer] Dietetics to pt's AVS/Discharge Instructions  NUTRITION DIAGNOSIS:   Inadequate oral intake related to poor appetite as evidenced by per patient/family report.   GOAL:   Patient will meet greater than or equal to 90% of their needs  MONITOR:   Supplement acceptance, PO intake, Labs, Weight trends  REASON FOR ASSESSMENT:   Malnutrition Screening Tool    ASSESSMENT:   68 year old male who presented to the ED on 5/22 with SOB and hemoptysis. PMH significant for tobacco abuse. Chest x-ray that showed evidence of consolidation noted to the medial right upper lobe concerning for mass versus consolidation. Pt admitted with COPD exacerbation. Concern for malignancy.  Per H&P, pt "endorses 25 lb unintentional weight loss over the past 6 weeks."  Pt refusing IV abx and needle sticks.  Reviewed RN edema assessment. Pt with mild pitting edema to BLE.  Spoke with pt via phone call to room. Pt able to answer most of RD questions but appeared to be having difficulty catching his breath.  Pt endorses experiencing weight loss recently but is unsure when it started. Pt states, "I've been trying to get my weight back up." Pt reports that his UBW is 140 lbs but is unsure when he last weighed that. No weight history available in chart.  Pt reports that he noticed a decrease in his appetite 1-2 months ago. Pt is not sure why his appetite decreased. Pt states that over the last 1-2 months, he has been eating 1-2 meals daily which is less than usual. When  asked what pt may eat at a meal, pt states "it varies from day to day." Unable to obtain more details.  Pt denies any N/V at this time. Pt states, "I just can't breathe." Pt amenable to oral nutrition supplement to aid in meeting kcal and protein needs. RD will also order daily MVI.  Meal Completion: 100% x 1 meal  Medications reviewed and include: Ensure Enlive BID, folic acid, Solu-medrol, thiamine  Labs reviewed.  NUTRITION - FOCUSED PHYSICAL EXAM:  Unable to complete at this time. RD working remotely.  Diet Order:   Diet Order            Diet Heart Room service appropriate? Yes; Fluid consistency: Thin  Diet effective now              EDUCATION NEEDS:   Education needs have been addressed  Skin:  Skin Assessment: Reviewed RN Assessment  Last BM:  no documented BM  Height:   Ht Readings from Last 1 Encounters:  09/09/2018 5\' 9"  (1.753 m)    Weight:   Wt Readings from Last 1 Encounters:  09/18/18 47.8 kg    Ideal Body Weight:  72.7 kg  BMI:  Body mass index is 15.56 kg/m.  Estimated Nutritional Needs:   Kcal:  1700-1900  Protein:  75-90 grams  Fluid:  >/= 1.7 L    Gaynell Face, MS, RD, LDN Inpatient Clinical Dietitian Pager: [phone] Weekend/After Hours: [phone]

## 2018-09-18 NOTE — Progress Notes (Signed)
Pt is refusing further IV attempts.

## 2018-09-18 NOTE — Discharge Instructions (Signed)
HIGH-CALORIE, HIGH-PROTEIN NUTRITION THERAPY  A high-calorie, high-protein diet has been recommended for you either because you cant eat enough calories throughout the day, have lost weight, or need to add protein to your diet. Following the recommendations on this handout can help you:  Gain weight and give your body energy  Get more protein from foods that help your body heal and grow strong  Recover from surgery or illness  Tips to Eat More Calories and Protein:  1. Aim for at [facility] 6 Meals and Snacks Each Day  Extra meals and snacks can help you get enough calories and protein.  You may want to try high-calorie supplement drinks (made at home or bought at a store) periodically between meals to get more calories each day. ? If you buy the drink at the store, read the label to look for products with 200-400 calories per serving. ? If you make the drink at home, you can increase calories by adding protein ingredients such as nonfat milk, low-fat yogurt, nonfat milk powder, or protein powder.  Enjoy snacks such as milkshakes, smoothies, pudding, ice cream, or custard.  2. Eat More Fat  Fat provides a lot of calories in just a few bites. A tablespoon of oil, butter, or margarine has about 100 calories.  Add butter, margarine, or oil to bread, potatoes, vegetables, and soups.  Use mayonnaise, salad dressing, and peanut butter freely.  3. Choose High-Protein Foods  Enjoy milk, eggs, cheese, meat, fish, poultry, and beans. Consider trying protein powders and meal replacement shakes and bars.  Choose higher-fat meats. They have more calories than lean meats. ? Examples include chicken thighs, marbled meats, bacon, sausage, poultry with skin  Choose whole milk instead of low-fat or skim milk.  Eat high-fat cheeses instead of low-fat or nonfat cheeses.  4. Shopping Tips  Avoid diet, low-calorie, or low-fat food items.  Look for dairy products (milk, cheese, yogurt, cottage  cheese) that are labeled whole fat or have at least 4% fat.  Purchase nonfat dry milk powder or protein powder to use to make shakes or other blended recipes.    5. Cooking Tips  Make a high-protein milk recipe like the one below. The recipe can be prepared in advance and stored in the refrigerator until you are ready to drink it. Use this high-protein milk in recipes that call for milk or drink it as a beverage.  ? 1 cup whole milk ?  cup nonfat dry milk powder  Add cheese sauce, butter, and sour cream to vegetable and potato dishes.  Get extra calories by adding condensed milk, cream, butter, nut butters, and sweetener to hot cereals, mashed potato, pudding, and soups. Examples: ? Prepare oatmeal with condensed milk, butter/nut butter, and brown sugar ? Prepare mashed potatoes with cream, butter, and cheese ? Prepare soup with cream and extra butter, or puree the soup with cream to make a bisque ? Add cream to pudding mix or use pudding dry mix in cakes/baked goods  Serve items with extra sauces. These contain additional calories: ? Gravy on meats and potatoes ? Extra mayonnaise, BBQ sauce or ketchup  Dipping sauces, hummus, and regular (not low-fat/low-calorie) salad dressing     Foods Recommended Calories Protein in grams (g)      Protein Foods    1 cup cooked dried beans 240 14   cup chicken salad 200 14  1 egg cooked with 1 tablespoon butter 175 6  3 ounces tuna canned in oil 170 25  cup egg substitute 25 5      1  ounce pecans (20 halves) 200 3  1 ounce macadamia nuts (10-12 nuts) 200 2  1 ounce [place] nuts (6-8 nuts) 190 4  1 ounce walnuts (14 halves) 185 4  1 ounce shelled sunflower seeds 175 6  1 ounce almonds (about 24) 165 4  1 ounce peanuts 165 7  1 tablespoon peanut butter 95 4       cup canned evaporated milk (can be used instead of water when cooking) 170 9  6 ounces sweetened yogurt 165 6   cup ice cream 130 2-3   cup (1 ounce)  shredded cheese 115 7   cup creamed cottage cheese 110 13   cup half-and-half 80 2   cup whole milk (can be used instead of water when cooking) 75 4  1 tablespoon cream cheese 50 1  2 tablespoons sour cream 50 0      Fats    1 tablespoon butter, margarine, oil, or mayonnaise 100 0  2 tablespoons gravy 4 1      Sweets    1 tablespoon honey 60 0  1 tablespoon sugar, jam, jelly, or chocolate syrup 50 0      Meal Replacements    1 meal replacement bar 200 15  1 scoop (1 ounce) protein powder 100 15  1 tablespoon protein powder 40 5    High-Calorie, High-Protein Sample 1-Day Menu Breakfast 1 large egg, scrambled 1 medium biscuit 1 tablespoon jam 2 tablespoon butter 1 cup apple juice  Morning Snack 1 cup instant pudding  Lunch 4 oz tuna salad (with mayonnaise, oil, relish) 1 hard-boiled egg 2 canned peach halves 2 tablespoons cream cheese 4 walnut halves 1 cup grape juice  Afternoon Snack 1/2 cup orange juice in smoothie 1/4 cup frozen strawberries in smoothie 1 banana in smoothie 1 oz protein powder in smoothie  Evening Meal 3 oz ground beef patty 2 tablespoons gravy 3 large stalks broccoli 2 tablespoons cheese sauce 2 slices bread 1 tablespoon butter  Evening Snack 1 medium scoop ice cream 2 tablespoons chocolate syrup

## 2018-09-18 NOTE — Evaluation (Signed)
Physical Therapy One Time Evaluation Patient Details Name: NYAIRE DENBLEYKER MRN: [id number] DOB: 11-30-1950 Today's Date: 09/18/2018   History of Present Illness  Mr. Frith is a 68 year old gentleman with history of tobacco abuse who presents with progressive shortness of breath and hemoptysis. He endorses 25lb unintentional weight loss over the past 6 weeks  Clinical Impression  Pt admitted with above diagnosis. Pt currently without significant functional limitations and states he is at his baseline and does not need therapy.  Pt was safe with ambulation in room.  No further skilled PT indicated.  Sign off.      Follow Up Recommendations No PT follow up    Equipment Recommendations  None recommended by PT    Recommendations for Other Services       Precautions / Restrictions Precautions Precautions: Fall Restrictions Weight Bearing Restrictions: No      Mobility  Bed Mobility Overal bed mobility: Independent                Transfers Overall transfer level: Independent                  Ambulation/Gait Ambulation/Gait assistance: Supervision Gait Distance (Feet): 65 Feet Assistive device: None Gait Pattern/deviations: Decreased stride length;Step-through pattern;Trunk flexed;Narrow base of support   Gait velocity interpretation: <1.31 ft/sec, indicative of household ambulator General Gait Details: Walked several laps in room with supervision only. Pt states he is at baseline.  VSS and did not desat.    Stairs            Wheelchair Mobility    Modified Rankin (Stroke Patients Only)       Balance Overall balance assessment: Needs assistance Sitting-balance support: No upper extremity supported;Feet supported Sitting balance-Leahy Scale: Fair     Standing balance support: No upper extremity supported;During functional activity Standing balance-Leahy Scale: Fair Standing balance comment: No LOB without challenges.  Pt hesitant to do a  lot with therapy therefore PT limited but for general mobility, pt is fairly stable.                              Pertinent Vitals/Pain Pain Assessment: No/denies pain    Home Living Family/patient expects to be discharged to:: Private residence Living Arrangements: Alone   Type of Home: Other(Comment) Home Access: Level entry     Home Layout: One level Home Equipment: None      Prior Function Level of Independence: Independent         Comments: does own cooking and cleaning. does not drive     Hand Dominance        Extremity/Trunk Assessment   Upper Extremity Assessment Upper Extremity Assessment: Defer to OT evaluation    Lower Extremity Assessment Lower Extremity Assessment: Generalized weakness    Cervical / Trunk Assessment Cervical / Trunk Assessment: Kyphotic  Communication   Communication: No difficulties  Cognition Arousal/Alertness: Awake/alert Behavior During Therapy: WFL for tasks assessed/performed Overall Cognitive Status: Within Functional Limits for tasks assessed                                        General Comments      Exercises     Assessment/Plan    PT Assessment Patent does not need any further PT services  PT Problem List         PT  Treatment Interventions Gait training    PT Goals (Current goals can be found in the Care Plan section)  Acute Rehab PT Goals Patient Stated Goal: go home today PT Goal Formulation: All assessment and education complete, DC therapy    Frequency     Barriers to discharge        Co-evaluation               AM-PAC PT "6 Clicks" Mobility  Outcome Measure Help needed turning from your back to your side while in a flat bed without using bedrails?: None Help needed moving from lying on your back to sitting on the side of a flat bed without using bedrails?: None Help needed moving to and from a bed to a chair (including a wheelchair)?: None Help needed  standing up from a chair using your arms (e.g., wheelchair or bedside chair)?: None Help needed to walk in hospital room?: None Help needed climbing 3-5 steps with a railing? : None 6 Click Score: 24    End of Session Equipment Utilized During Treatment: Gait belt Activity Tolerance: Patient limited by fatigue Patient left: in bed;with call bell/phone within reach Nurse Communication: Mobility status PT Visit Diagnosis: Muscle weakness (generalized) (M62.81)    Time: 3235-5732 PT Time Calculation (min) (ACUTE ONLY): 10 min   Charges:   PT Evaluation $PT Eval Low Complexity: 1 Low          Kenadie Royce,PT Acute Rehabilitation Services Pager:  [phone]  Office:  [place] 09/18/2018, 1:31 PM

## 2018-09-18 NOTE — Progress Notes (Signed)
PICC RN went to patient room to get consent for PICC line placement.  Patient refused stating "my veins have been sucked dry, they need to heal"  PICC RN offered again to at least place a "regular" peripheral IV, patient refused.  RN made aware.

## 2018-09-18 NOTE — Plan of Care (Signed)
  Problem: Education: Goal: Knowledge of General Education information will improve Description Including pain rating scale, medication(s)/side effects and non-pharmacologic comfort measures Outcome: Progressing   Problem: Clinical Measurements: Goal: Will remain free from infection Outcome: Progressing Goal: Cardiovascular complication will be avoided Outcome: Progressing   Problem: Elimination: Goal: Will not experience complications related to bowel motility Outcome: Progressing Goal: Will not experience complications related to urinary retention Outcome: Progressing   Problem: Safety: Goal: Ability to remain free from injury will improve Outcome: Progressing   Problem: Skin Integrity: Goal: Risk for impaired skin integrity will decrease Outcome: Progressing   Problem: Respiratory: Goal: Ability to maintain a clear airway will improve Outcome: Progressing

## 2018-09-18 NOTE — Progress Notes (Signed)
Pt continues to refuse PIV attempts

## 2018-09-18 NOTE — Progress Notes (Signed)
Pt refused PICC line and IV line placement. Refused all care. Threatened to leave AMA if we stick him again. RN educated pt on the severity of his illness. RN called pt's daughter to update her on the current situation. DA stated she will attempt to convince him. MD made aware. Will continue to monitor pt.

## 2018-09-18 NOTE — Evaluation (Signed)
Occupational Therapy Evaluation and Discharge Patient Details Name: Anthony Dorsey AGE MRN: [id number] DOB: 1950/07/04 Today's Date: 09/18/2018    History of Present Illness Anthony Dorsey is a 68 year old gentleman with history of tobacco abuse who presents with progressive shortness of breath and hemoptysis. He endorses 25lb unintentional weight loss over the past 6 weeks   Clinical Impression   PTA Pt reports that he was independent in ambulation and ADL/IADL. Today Pt is at baseline, able to perform transfers and ADL without physical assist or cues. Pt with noted increased weezing with activity. However, Pt is adamant that he is at baseline and does not want further therapy. OT education complete, OT to sign off at this time.     Follow Up Recommendations  No OT follow up    Equipment Recommendations  None recommended by OT    Recommendations for Other Services       Precautions / Restrictions Precautions Precautions: Fall Restrictions Weight Bearing Restrictions: No      Mobility Bed Mobility Overal bed mobility: Independent                Transfers Overall transfer level: Independent                    Balance Overall balance assessment: Needs assistance Sitting-balance support: No upper extremity supported;Feet supported Sitting balance-Leahy Scale: Fair     Standing balance support: No upper extremity supported;During functional activity Standing balance-Leahy Scale: Fair Standing balance comment: No LOB without challenges.  Pt hesitant to do a lot with therapy therefore PT limited but for general mobility, pt is fairly stable.                            ADL either performed or assessed with clinical judgement   ADL Overall ADL's : At baseline                                       General ADL Comments: min guard for everything, weezing increases with activity     Vision Baseline Vision/History: No visual  deficits       Perception     Praxis      Pertinent Vitals/Pain Pain Assessment: No/denies pain     Hand Dominance     Extremity/Trunk Assessment Upper Extremity Assessment Upper Extremity Assessment: Overall WFL for tasks assessed   Lower Extremity Assessment Lower Extremity Assessment: Generalized weakness   Cervical / Trunk Assessment Cervical / Trunk Assessment: Kyphotic   Communication Communication Communication: No difficulties   Cognition Arousal/Alertness: Awake/alert Behavior During Therapy: WFL for tasks assessed/performed Overall Cognitive Status: Within Functional Limits for tasks assessed                                     General Comments       Exercises     Shoulder Instructions      Home Living Family/patient expects to be discharged to:: Private residence Living Arrangements: Alone   Type of Home: Other(Comment) Home Access: Level entry     Home Layout: One level     Bathroom Shower/Tub: Teacher, early years/pre: Standard     Home Equipment: None          Prior Functioning/Environment Level of Independence: Independent  Comments: does own cooking and cleaning. does not drive        OT Problem List: Decreased activity tolerance      OT Treatment/Interventions:      OT Goals(Current goals can be found in the care plan section) Acute Rehab OT Goals Patient Stated Goal: get the weezing to stop OT Goal Formulation: With patient Time For Goal Achievement: 10/02/18 Potential to Achieve Goals: Good  OT Frequency:     Barriers to D/C:            Co-evaluation              AM-PAC OT "6 Clicks" Daily Activity     Outcome Measure Help from another person eating meals?: None Help from another person taking care of personal grooming?: None Help from another person toileting, which includes using toliet, bedpan, or urinal?: None Help from another person bathing (including washing,  rinsing, drying)?: A Little Help from another person to put on and taking off regular upper body clothing?: None Help from another person to put on and taking off regular lower body clothing?: None 6 Click Score: 23   End of Session Nurse Communication: Mobility status  Activity Tolerance: Patient tolerated treatment well Patient left: in bed;with call bell/phone within reach  OT Visit Diagnosis: Muscle weakness (generalized) (M62.81)                Time: 0240-9735 OT Time Calculation (min): 10 min Charges:  OT General Charges $OT Visit: 1 Visit OT Evaluation $OT Eval Low Complexity: [place] OTR/L Acute Rehabilitation Services Pager: [phone] Office: [place] 09/18/2018, 2:25 PM

## 2018-09-18 NOTE — Progress Notes (Signed)
PROGRESS NOTE    Anthony Dorsey  QHU:[id number] DOB: 1950/10/29 DOA: 09/20/2018 PCP: Patient, No Pcp Per    Brief Narrative:  68 y.o. male with medical history significant of tobacco abuse    Presented with 1 month of worsening dyspnea, he got tired of feeling like that and came in to ER. Reports wheezing ongoing tobacco abuse 1.5 pack/day. Symptom onset gradual nothing seems to make it better reports productive cough and occasional chest pain while coughing but no chest pain at baseline.  No fevers or chills.  Assessment & Plan:   Active Problems:   COPD with acute exacerbation (HCC)   Tobacco abuse   Acute respiratory failure with hypoxia (HCC)   Hypercalcemia   Lung mass   Prolonged QT interval   COPD exacerbation (HCC)   Alcohol abuse   Hemoptysis  . COPD with acute exacerbation ([place]) -    -Still with marked wheezing on exam, heard without needing stethoscope  -  Antibiotics antibiotics Rocephin and doxy continued -   Will continue with XopenexPRN with scheduled duoneb,  -  Breo or Dulera at discharge   - Given continued wheezing, will continue patient on scheduled IV solumedrol  . Tobacco abuse - -Tobacco cessation was done at time of admission -Continue with nicotine patch as tolerated  . Acute respiratory failure with hypoxia (HCC)  -Likely secondary to above COPD exacerbation -COVID testing neg  . Hypercalcemia  - medically stable at this time -Repeat levels in AM  . Lung mass -vs  pneumonia  -Initially unable to tolerate CT at time of admission -Encouraged pt to undergo study -On doxycycline and rocephin at time of admission  . Prolonged QT interval  -will monitor on tele avoid QT prolonging medications, rehydrate correct electrolytes -Repeat bmet in AM   . Alcohol abuse  - Continue CIWA protocol - Without evidence of withdrawal at this time   DVT prophylaxis: SCD's Code Status: DNR Family Communication: Pt in room, family not at  bedside Disposition Plan: Uncertain at this time  Consultants:     Procedures:     Antimicrobials: Anti-infectives (From admission, onward)   Start     Dose/Rate Route Frequency Ordered Stop   09/09/2018 2300  doxycycline (VIBRA-TABS) tablet 100 mg     100 mg Oral Every 12 hours 09/06/2018 2223     09/05/2018 2300  cefTRIAXone (ROCEPHIN) 1 g in sodium chloride 0.9 % 100 mL IVPB     1 g 200 mL/hr over 30 Minutes Intravenous Every 24 hours 08/28/2018 2223     09/19/2018 1530  amoxicillin (AMOXIL) capsule 500 mg     500 mg Oral  Once 08/27/2018 1520 09/01/2018 1529   08/31/2018 1530  azithromycin (ZITHROMAX) tablet 500 mg     500 mg Oral  Once 09/07/2018 1520 09/15/2018 1529   09/26/2018 1515  cefTRIAXone (ROCEPHIN) 1 g in sodium chloride 0.9 % 100 mL IVPB  Status:  Discontinued     1 g 200 mL/hr over 30 Minutes Intravenous  Once 09/05/2018 1512 08/29/2018 1520   09/16/2018 1515  azithromycin (ZITHROMAX) tablet 500 mg  Status:  Discontinued     500 mg Oral  Once 08/31/2018 1512 09/26/2018 1520       Subjective: Complaining of not tolerating nasal canula  Objective: Vitals:   09/18/18 0453 09/18/18 0810 09/18/18 0824 09/18/18 1157  BP: (!) 107/54 106/73  123/85  Pulse: 83 81  88  Resp: 17 16  17   Temp: 98.2 F (36.8 C)  97.9 F (36.6 C)  97.7 F (36.5 C)  TempSrc: Oral Oral  Oral  SpO2: 95% 97% 97% 95%  Weight: 47.8 kg     Height:        Intake/Output Summary (Last 24 hours) at 09/18/2018 1237 Last data filed at 09/18/2018 1100 Gross per 24 hour  Intake 477 ml  Output 400 ml  Net 77 ml   Filed Weights   08/30/2018 1245 09/18/18 0453  Weight: 63.5 kg 47.8 kg    Examination:  General exam: Appears calm and comfortable  Respiratory system: increased resp effort, wheezing can be heard without stethoscope. Cardiovascular system: S1 & S2 heard, RRR Gastrointestinal system: Abdomen is nondistended, soft and nontender. No organomegaly or masses felt. Normal bowel sounds heard. Central nervous  system: Alert and oriented. No focal neurological deficits. Extremities: Symmetric 5 x 5 power. Skin: No rashes, lesions  Psychiatry: Judgement and insight appear normal. Mood & affect appropriate.   Data Reviewed: I have personally reviewed following labs and imaging studies  CBC: Recent Labs  Lab 09/11/2018 1305 09/18/18 0801  WBC 14.2* 12.8*  NEUTROABS 9.4* 7.8*  HGB 12.5* 12.2*  HCT 39.3 37.2*  MCV 92.5 92.3  PLT 528* PLATELETS APPEAR ADEQUATE   Basic Metabolic Panel: Recent Labs  Lab 09/25/2018 1305  NA 138  K 3.6  CL 99  CO2 25  GLUCOSE 114*  BUN 6*  CREATININE 0.58*  CALCIUM 10.8*   GFR: Estimated Creatinine Clearance: 62.2 mL/min (A) (by C-G formula based on SCr of 0.58 mg/dL (L)). Liver Function Tests: No results for input(s): AST, ALT, ALKPHOS, BILITOT, PROT, ALBUMIN in the last 168 hours. No results for input(s): LIPASE, AMYLASE in the last 168 hours. No results for input(s): AMMONIA in the last 168 hours. Coagulation Profile: No results for input(s): INR, PROTIME in the last 168 hours. Cardiac Enzymes: Recent Labs  Lab 09/08/2018 1305  TROPONINI <0.03   BNP (last 3 results) No results for input(s): PROBNP in the last 8760 hours. HbA1C: No results for input(s): HGBA1C in the last 72 hours. CBG: No results for input(s): GLUCAP in the last 168 hours. Lipid Profile: No results for input(s): CHOL, HDL, LDLCALC, TRIG, CHOLHDL, LDLDIRECT in the last 72 hours. Thyroid Function Tests: No results for input(s): TSH, T4TOTAL, FREET4, T3FREE, THYROIDAB in the last 72 hours. Anemia Panel: No results for input(s): VITAMINB12, FOLATE, FERRITIN, TIBC, IRON, RETICCTPCT in the last 72 hours. Sepsis Labs: No results for input(s): PROCALCITON, LATICACIDVEN in the last 168 hours.  Recent Results (from the past 240 hour(s))  SARS Coronavirus 2 (CEPHEID- Performed in [place] hospital lab), Hosp Order     Status: None   Collection Time: 09/19/2018  1:17 PM  Result Value  Ref Range Status   SARS Coronavirus 2 NEGATIVE NEGATIVE Final    Comment: (NOTE) If result is NEGATIVE SARS-CoV-2 target nucleic acids are NOT DETECTED. The SARS-CoV-2 RNA is generally detectable in upper and lower  respiratory specimens during the acute phase of infection. The lowest  concentration of SARS-CoV-2 viral copies this assay can detect is 250  copies / mL. A negative result does not preclude SARS-CoV-2 infection  and should not be used as the sole basis for treatment or other  patient management decisions.  A negative result may occur with  improper specimen collection / handling, submission of specimen other  than nasopharyngeal swab, presence of viral mutation(s) within the  areas targeted by this assay, and inadequate number of viral copies  (<250  copies / mL). A negative result must be combined with clinical  observations, patient history, and epidemiological information. If result is POSITIVE SARS-CoV-2 target nucleic acids are DETECTED. The SARS-CoV-2 RNA is generally detectable in upper and lower  respiratory specimens dur ing the acute phase of infection.  Positive  results are indicative of active infection with SARS-CoV-2.  Clinical  correlation with patient history and other diagnostic information is  necessary to determine patient infection status.  Positive results do  not rule out bacterial infection or co-infection with other viruses. If result is PRESUMPTIVE POSTIVE SARS-CoV-2 nucleic acids MAY BE PRESENT.   A presumptive positive result was obtained on the submitted specimen  and confirmed on repeat testing.  While 2019 novel coronavirus  (SARS-CoV-2) nucleic acids may be present in the submitted sample  additional confirmatory testing may be necessary for epidemiological  and / or clinical management purposes  to differentiate between  SARS-CoV-2 and other Sarbecovirus currently known to infect humans.  If clinically indicated additional testing with an  alternate test  methodology [phone]) is advised. The SARS-CoV-2 RNA is generally  detectable in upper and lower respiratory sp ecimens during the acute  phase of infection. The expected result is Negative. Fact Sheet for Patients:  StrictlyIdeas.no Fact Sheet for Healthcare Providers: BankingDealers.co.za This test is not yet approved or cleared by the [place] FDA and has been authorized for detection and/or diagnosis of SARS-CoV-2 by FDA under an Emergency Use Authorization (EUA).  This EUA will remain in effect (meaning this test can be used) for the duration of the COVID-19 declaration under Section 564(b)(1) of the Act, 21 U.S.C. section 360bbb-3(b)(1), unless the authorization is terminated or revoked sooner. Performed at [facility], [place] [address], [place], [place] [address]   MRSA PCR Screening     Status: None   Collection Time: 09/15/2018 10:31 PM  Result Value Ref Range Status   MRSA by PCR NEGATIVE NEGATIVE Final    Comment:        The GeneXpert MRSA Assay (FDA approved for NASAL specimens only), is one component of a comprehensive MRSA colonization surveillance program. It is not intended to diagnose MRSA infection nor to guide or monitor treatment for MRSA infections. Performed at [facility], [place] [address], [place], [place] [address]      Radiology Studies: Dg Chest 2 View  Result Date: 09/25/2018 CLINICAL DATA:  Wheezing and shortness of breath EXAM: CHEST - 2 VIEW COMPARISON:  None. FINDINGS: The heart size and mediastinal contours are within normal limits. Emphysema. There is paramedian opacity and/or consolidation of the medial right upper lobe. Disc degenerative disease of the thoracic spine. IMPRESSION: 1. There is paramedian opacity and/or consolidation of the medial right upper lobe, concerning for mass or consolidation. Recommend CT to further evaluate. 2.  Emphysema. Electronically  Signed   By: Eddie Candle M.D.   On: 09/12/2018 13:51    Scheduled Meds: . doxycycline  100 mg Oral Q12H  . feeding supplement (ENSURE ENLIVE)  237 mL Oral TID BM  . folic acid  1 mg Oral Daily  . ipratropium-albuterol  3 mL Nebulization Q6H  . methylPREDNISolone (SOLU-MEDROL) injection  40 mg Intravenous Q12H  . mometasone-formoterol  1 puff Inhalation BID  . multivitamin with minerals  1 tablet Oral Daily  . nicotine  21 mg Transdermal Daily  . sodium chloride flush  3 mL Intravenous Q12H  . thiamine  100 mg Oral Daily   Continuous Infusions: .  sodium chloride    . cefTRIAXone (ROCEPHIN)  IV       LOS: 1 day   Marylu Lund, MD [facility] Pager On Amion  If 7PM-7AM, please contact night-coverage 09/18/2018, 12:37 PM

## 2018-09-19 MED ORDER — PREDNISONE 50 MG PO TABS
60.0000 mg | ORAL_TABLET | Freq: Every day | ORAL | Status: DC
  Administered 2018-09-19 – 2018-09-20 (×2): 60 mg via ORAL
  Filled 2018-09-19 (×2): qty 1

## 2018-09-19 MED ORDER — IPRATROPIUM-ALBUTEROL 0.5-2.5 (3) MG/3ML IN SOLN
3.0000 mL | Freq: Three times a day (TID) | RESPIRATORY_TRACT | Status: DC
  Administered 2018-09-20 – 2018-09-26 (×20): 3 mL via RESPIRATORY_TRACT
  Filled 2018-09-19 (×20): qty 3

## 2018-09-19 MED ORDER — LIDOCAINE-PRILOCAINE 2.5-2.5 % EX CREA
TOPICAL_CREAM | Freq: Every day | CUTANEOUS | Status: DC | PRN
  Administered 2018-09-20: 12:00:00 via TOPICAL
  Filled 2018-09-19: qty 5

## 2018-09-19 NOTE — Progress Notes (Signed)
Paged provider to advise at team at bedside to place PICC patient refused.

## 2018-09-19 NOTE — Progress Notes (Signed)
At bedside to place PICC, pt continue to refused PICC insertion; RN &MD aware.

## 2018-09-19 NOTE — Progress Notes (Signed)
PROGRESS NOTE    Anthony Dorsey  TLX:[id number] DOB: [birth year] DOA: 09/08/2018 PCP: Patient, No Pcp Per    Brief Narrative:  68 y.o. male with medical history significant of tobacco abuse    Presented with 1 month of worsening dyspnea, he got tired of feeling like that and came in to ER. Reports wheezing ongoing tobacco abuse 1.5 pack/day. Symptom onset gradual nothing seems to make it better reports productive cough and occasional chest pain while coughing but no chest pain at baseline.  No fevers or chills.  Assessment & Plan:   Active Problems:   COPD with acute exacerbation (HCC)   Tobacco abuse   Acute respiratory failure with hypoxia (HCC)   Hypercalcemia   Lung mass   Prolonged QT interval   COPD exacerbation (HCC)   Alcohol abuse   Hemoptysis  . COPD with acute exacerbation and acute hypoxemic respiratory failure ([place]) -    -Still with marked wheezing on exam, heard without needing stethoscope  -  Antibiotics antibiotics Rocephin and doxy continued -   Will continue with XopenexPRN with scheduled duoneb,  -  Breo or Dulera at discharge   - Will need IV steroids, however patient is refusing IV access. Given acute respiratory failure, and desaturation, patient is not stable for discharge - For now, will give trial of PO prednisone and continue to encourage IV access. Ideally, would like to continue patient on scheduled IV steroids  . Tobacco abuse - -Tobacco cessation was done at time of admission -Continue with nicotine patch as tolerated -Stable at this time  . Acute respiratory failure with hypoxia (HCC)  -Likely secondary to above COPD exacerbation -COVID testing neg  . Hypercalcemia  - medically stable at this time -Repeat BMET had been ordered, however patient has been refusing blood draws  . Lung mass -vs  pneumonia  -Initially unable to tolerate CT at time of admission -Encouraged pt to undergo study -On doxycycline and rocephin at time of  admission  . Prolonged QT interval  -will monitor on tele avoid QT prolonging medications, rehydrate correct electrolytes as needed -Patient has been refusing labs   . Alcohol abuse  - Continue CIWA protocol - Without evidence of withdrawal at this time  DVT prophylaxis: SCD's Code Status: DNR Family Communication: Pt in room, family not at bedside Disposition Plan: Uncertain at this time  Consultants:     Procedures:     Antimicrobials: Anti-infectives (From admission, onward)   Start     Dose/Rate Route Frequency Ordered Stop   09/18/2018 2300  doxycycline (VIBRA-TABS) tablet 100 mg     100 mg Oral Every 12 hours 09/20/2018 2223     09/12/2018 2300  cefTRIAXone (ROCEPHIN) 1 g in sodium chloride 0.9 % 100 mL IVPB     1 g 200 mL/hr over 30 Minutes Intravenous Every 24 hours 09/11/2018 2223     09/24/2018 1530  amoxicillin (AMOXIL) capsule 500 mg     500 mg Oral  Once 09/23/2018 1520 09/08/2018 1529   09/03/2018 1530  azithromycin (ZITHROMAX) tablet 500 mg     500 mg Oral  Once 09/02/2018 1520 09/16/2018 1529   09/10/2018 1515  cefTRIAXone (ROCEPHIN) 1 g in sodium chloride 0.9 % 100 mL IVPB  Status:  Discontinued     1 g 200 mL/hr over 30 Minutes Intravenous  Once 09/26/2018 1512 09/19/2018 1520   09/11/2018 1515  azithromycin (ZITHROMAX) tablet 500 mg  Status:  Discontinued     500 mg Oral  Once 09/18/2018 1512 09/07/2018 1520      Subjective: Claims to be breathing fine without O2, later found to be gasping for breaths after patient placed himself on room air later.  Objective: Vitals:   09/18/18 2300 09/19/18 0730 09/19/18 0820 09/19/18 1059  BP: 102/76  112/74 116/79  Pulse: 97  97 94  Resp: (!) 25  19 (!) 21  Temp: 98.2 F (36.8 C)  98 F (36.7 C) 98 F (36.7 C)  TempSrc: Oral  Oral Oral  SpO2: 98% 97% 92% 100%  Weight:      Height:        Intake/Output Summary (Last 24 hours) at 09/19/2018 1254 Last data filed at 09/19/2018 1251 Gross per 24 hour  Intake 900 ml  Output 50 ml   Net 850 ml   Filed Weights   09/20/2018 1245 09/18/18 0453  Weight: 63.5 kg 47.8 kg    Examination: General exam: Awake, laying in bed, in nad Respiratory system: Increased resp effort, audible wheezing heard at doorway Cardiovascular system: regular rate, s1, s2 Gastrointestinal system: Soft, nondistended, positive BS Central nervous system: CN2-12 grossly intact, strength intact Extremities: Perfused, no clubbing Skin: Normal skin turgor, no notable skin lesions seen Psychiatry: Mood normal // no visual hallucinations    Data Reviewed: I have personally reviewed following labs and imaging studies  CBC: Recent Labs  Lab 09/10/2018 1305 09/18/18 0801  WBC 14.2* 12.8*  NEUTROABS 9.4* 7.8*  HGB 12.5* 12.2*  HCT 39.3 37.2*  MCV 92.5 92.3  PLT 528* PLATELETS APPEAR ADEQUATE   Basic Metabolic Panel: Recent Labs  Lab 08/27/2018 1305  NA 138  K 3.6  CL 99  CO2 25  GLUCOSE 114*  BUN 6*  CREATININE 0.58*  CALCIUM 10.8*   GFR: Estimated Creatinine Clearance: 62.2 mL/min (A) (by C-G formula based on SCr of 0.58 mg/dL (L)). Liver Function Tests: No results for input(s): AST, ALT, ALKPHOS, BILITOT, PROT, ALBUMIN in the last 168 hours. No results for input(s): LIPASE, AMYLASE in the last 168 hours. No results for input(s): AMMONIA in the last 168 hours. Coagulation Profile: No results for input(s): INR, PROTIME in the last 168 hours. Cardiac Enzymes: Recent Labs  Lab 09/01/2018 1305  TROPONINI <0.03   BNP (last 3 results) No results for input(s): PROBNP in the last 8760 hours. HbA1C: No results for input(s): HGBA1C in the last 72 hours. CBG: No results for input(s): GLUCAP in the last 168 hours. Lipid Profile: No results for input(s): CHOL, HDL, LDLCALC, TRIG, CHOLHDL, LDLDIRECT in the last 72 hours. Thyroid Function Tests: No results for input(s): TSH, T4TOTAL, FREET4, T3FREE, THYROIDAB in the last 72 hours. Anemia Panel: No results for input(s): VITAMINB12, FOLATE,  FERRITIN, TIBC, IRON, RETICCTPCT in the last 72 hours. Sepsis Labs: No results for input(s): PROCALCITON, LATICACIDVEN in the last 168 hours.  Recent Results (from the past 240 hour(s))  SARS Coronavirus 2 (CEPHEID- Performed in [place] hospital lab), Hosp Order     Status: None   Collection Time: 09/26/2018  1:17 PM  Result Value Ref Range Status   SARS Coronavirus 2 NEGATIVE NEGATIVE Final    Comment: (NOTE) If result is NEGATIVE SARS-CoV-2 target nucleic acids are NOT DETECTED. The SARS-CoV-2 RNA is generally detectable in upper and lower  respiratory specimens during the acute phase of infection. The lowest  concentration of SARS-CoV-2 viral copies this assay can detect is 250  copies / mL. A negative result does not preclude SARS-CoV-2 infection  and should  not be used as the sole basis for treatment or other  patient management decisions.  A negative result may occur with  improper specimen collection / handling, submission of specimen other  than nasopharyngeal swab, presence of viral mutation(s) within the  areas targeted by this assay, and inadequate number of viral copies  (<250 copies / mL). A negative result must be combined with clinical  observations, patient history, and epidemiological information. If result is POSITIVE SARS-CoV-2 target nucleic acids are DETECTED. The SARS-CoV-2 RNA is generally detectable in upper and lower  respiratory specimens dur ing the acute phase of infection.  Positive  results are indicative of active infection with SARS-CoV-2.  Clinical  correlation with patient history and other diagnostic information is  necessary to determine patient infection status.  Positive results do  not rule out bacterial infection or co-infection with other viruses. If result is PRESUMPTIVE POSTIVE SARS-CoV-2 nucleic acids MAY BE PRESENT.   A presumptive positive result was obtained on the submitted specimen  and confirmed on repeat testing.  While 2019  novel coronavirus  (SARS-CoV-2) nucleic acids may be present in the submitted sample  additional confirmatory testing may be necessary for epidemiological  and / or clinical management purposes  to differentiate between  SARS-CoV-2 and other Sarbecovirus currently known to infect humans.  If clinically indicated additional testing with an alternate test  methodology [phone]) is advised. The SARS-CoV-2 RNA is generally  detectable in upper and lower respiratory sp ecimens during the acute  phase of infection. The expected result is Negative. Fact Sheet for Patients:  StrictlyIdeas.no Fact Sheet for Healthcare Providers: BankingDealers.co.za This test is not yet approved or cleared by the [place] FDA and has been authorized for detection and/or diagnosis of SARS-CoV-2 by FDA under an Emergency Use Authorization (EUA).  This EUA will remain in effect (meaning this test can be used) for the duration of the COVID-19 declaration under Section 564(b)(1) of the Act, 21 U.S.C. section 360bbb-3(b)(1), unless the authorization is terminated or revoked sooner. Performed at [facility], [place] [address] [place], [place] [address]   MRSA PCR Screening     Status: None   Collection Time: 09/26/2018 10:31 PM  Result Value Ref Range Status   MRSA by PCR NEGATIVE NEGATIVE Final    Comment:        The GeneXpert MRSA Assay (FDA approved for NASAL specimens only), is one component of a comprehensive MRSA colonization surveillance program. It is not intended to diagnose MRSA infection nor to guide or monitor treatment for MRSA infections. Performed at [facility], [place] [address] [place], [place] [address]      Radiology Studies: Dg Chest 2 View  Result Date: 09/10/2018 CLINICAL DATA:  Wheezing and shortness of breath EXAM: CHEST - 2 VIEW COMPARISON:  None. FINDINGS: The heart size and mediastinal contours are within normal  limits. Emphysema. There is paramedian opacity and/or consolidation of the medial right upper lobe. Disc degenerative disease of the thoracic spine. IMPRESSION: 1. There is paramedian opacity and/or consolidation of the medial right upper lobe, concerning for mass or consolidation. Recommend CT to further evaluate. 2.  Emphysema. Electronically Signed   By: Eddie Candle M.D.   On: 08/28/2018 13:51   [place] Ekg Site Rite  Result Date: 09/18/2018 If Site Rite image not attached, placement could not be confirmed due to current cardiac rhythm.   Scheduled Meds: . doxycycline  100 mg Oral Q12H  . feeding supplement (ENSURE ENLIVE)  237 mL Oral TID BM  . folic acid  1 mg Oral Daily  . ipratropium-albuterol  3 mL Nebulization Q6H  . mometasone-formoterol  1 puff Inhalation BID  . multivitamin with minerals  1 tablet Oral Daily  . nicotine  21 mg Transdermal Daily  . predniSONE  60 mg Oral Q breakfast  . sodium chloride flush  3 mL Intravenous Q12H  . thiamine  100 mg Oral Daily   Continuous Infusions: . sodium chloride    . cefTRIAXone (ROCEPHIN)  IV       LOS: 2 days   Marylu Lund, MD [facility] Pager On Amion  If 7PM-7AM, please contact night-coverage 09/19/2018, 12:54 PM

## 2018-09-19 NOTE — Progress Notes (Signed)
Pt found w/o O2 gasping for breath and diapheritic stating he was hot attempting to get to the thermometer. Pt O2 sats 87%. Pt using accessory muscle to breath. RN placed O2 back on pt increased to 4L pt O2 immediately rebounded to 90's. Gave patient wet cold towel stated "wasn't working" took towel off forehead. RN gave patient fan and waited at bedside with patient until calm. Pt stated hard to breath.  RN paged provider to advise. Awaiting orders. Page returned @1022 . Provider updated.  RN will continue to monitor.

## 2018-09-20 ENCOUNTER — Inpatient Hospital Stay (HOSPITAL_COMMUNITY): Payer: Medicare Other

## 2018-09-20 LAB — BLOOD GAS, ARTERIAL
Acid-Base Excess: 5.5 mmol/L — ABNORMAL HIGH (ref 0.0–2.0)
Bicarbonate: 30.4 mmol/L — ABNORMAL HIGH (ref 20.0–28.0)
Drawn by: 365271
O2 Content: 5 L/min
O2 Saturation: 98.4 %
Patient temperature: 98.6
pCO2 arterial: 53 mmHg — ABNORMAL HIGH (ref 32.0–48.0)
pH, Arterial: 7.377 (ref 7.350–7.450)
pO2, Arterial: 129 mmHg — ABNORMAL HIGH (ref 83.0–108.0)

## 2018-09-20 LAB — BASIC METABOLIC PANEL
Anion gap: 6 (ref 5–15)
BUN: 11 mg/dL (ref 8–23)
CO2: 33 mmol/L — ABNORMAL HIGH (ref 22–32)
Calcium: 10.3 mg/dL (ref 8.9–10.3)
Chloride: 96 mmol/L — ABNORMAL LOW (ref 98–111)
Creatinine, Ser: 0.5 mg/dL — ABNORMAL LOW (ref 0.61–1.24)
GFR calc Af Amer: >60 mL/min (ref >60)
GFR calc non Af Amer: >60 mL/min (ref >60)
Glucose, Bld: 124 mg/dL — ABNORMAL HIGH (ref 70–99)
Potassium: 3.9 mmol/L (ref 3.5–5.1)
Sodium: 135 mmol/L (ref 135–145)

## 2018-09-20 LAB — CBC
HCT: 34.4 % — ABNORMAL LOW (ref 39.0–52.0)
Hemoglobin: 10.9 g/dL — ABNORMAL LOW (ref 13.0–17.0)
MCH: 29.5 pg (ref 26.0–34.0)
MCHC: 31.7 g/dL (ref 30.0–36.0)
MCV: 93.2 fL (ref 80.0–100.0)
Platelets: 477 10*3/uL — ABNORMAL HIGH (ref 150–400)
RBC: 3.69 MIL/uL — ABNORMAL LOW (ref 4.22–5.81)
RDW: 15.8 % — ABNORMAL HIGH (ref 11.5–15.5)
WBC: 9 10*3/uL (ref 4.0–10.5)
nRBC: 0 % (ref 0.0–0.2)

## 2018-09-20 MED ORDER — SODIUM CHLORIDE 0.9% FLUSH: 10.0000 mL | INTRAVENOUS | Status: DC | PRN

## 2018-09-20 MED ORDER — LORAZEPAM 0.5 MG PO TABS
0.5000 mg | ORAL_TABLET | Freq: Once | ORAL | Status: AC | PRN
  Administered 2018-09-20: 12:00:00 0.5 mg via ORAL
  Filled 2018-09-20: qty 1

## 2018-09-20 MED ORDER — FUROSEMIDE 10 MG/ML IJ SOLN
40.0000 mg | Freq: Two times a day (BID) | INTRAMUSCULAR | Status: DC
  Administered 2018-09-21 – 2018-09-24 (×7): 40 mg via INTRAVENOUS
  Filled 2018-09-20 (×7): qty 4

## 2018-09-20 MED ORDER — FUROSEMIDE 10 MG/ML IJ SOLN
40.0000 mg | Freq: Once | INTRAMUSCULAR | Status: AC
  Administered 2018-09-20: 17:00:00 40 mg via INTRAVENOUS

## 2018-09-20 MED ORDER — SODIUM CHLORIDE 0.9 % IV SOLN
1.0000 g | INTRAVENOUS | Status: DC
  Administered 2018-09-20 – 2018-09-23 (×4): 1 g via INTRAVENOUS
  Filled 2018-09-20 (×4): qty 10

## 2018-09-20 MED ORDER — FUROSEMIDE 10 MG/ML IJ SOLN
INTRAMUSCULAR | Status: AC
  Administered 2018-09-20: 40 mg via INTRAVENOUS
  Filled 2018-09-20: qty 4

## 2018-09-20 MED ORDER — SODIUM CHLORIDE 0.9% FLUSH
10.0000 mL | Freq: Two times a day (BID) | INTRAVENOUS | Status: DC
  Administered 2018-09-20 – 2018-09-27 (×12): 10 mL

## 2018-09-20 MED ORDER — METHYLPREDNISOLONE SODIUM SUCC 40 MG IJ SOLR
40.0000 mg | Freq: Two times a day (BID) | INTRAMUSCULAR | Status: DC
  Administered 2018-09-20 – 2018-09-25 (×10): 40 mg via INTRAVENOUS
  Filled 2018-09-20 (×10): qty 1

## 2018-09-20 MED ORDER — SODIUM CHLORIDE 0.9 % IV SOLN
500.0000 mg | INTRAVENOUS | Status: DC
  Administered 2018-09-20 – 2018-09-23 (×4): 500 mg via INTRAVENOUS
  Filled 2018-09-20 (×5): qty 500

## 2018-09-20 NOTE — Progress Notes (Signed)
Patient had an episode where he felt like he was choking and couldn't breath. RN was called into room by patient. He was panicked and clutching chest and tearful stating he "couldn't breath". RN gave patient nebulizing breathing treatment. He had some relief with treatment. Will continue to monitor.

## 2018-09-20 NOTE — Progress Notes (Signed)
PROGRESS NOTE    Anthony Dorsey  IFO:[id number] DOB: 03/12/1951 DOA: 09/07/2018 PCP: Patient, No Pcp Per    Brief Narrative:  68 y.o. male with medical history significant of tobacco abuse    Presented with 1 month of worsening dyspnea, he got tired of feeling like that and came in to ER. Reports wheezing ongoing tobacco abuse 1.5 pack/day. Symptom onset gradual nothing seems to make it better reports productive cough and occasional chest pain while coughing but no chest pain at baseline.  No fevers or chills.  Assessment & Plan:   Active Problems:   COPD with acute exacerbation (HCC)   Tobacco abuse   Acute respiratory failure with hypoxia (HCC)   Hypercalcemia   Lung mass   Prolonged QT interval   COPD exacerbation (HCC)   Alcohol abuse   Hemoptysis  . COPD with acute exacerbation and acute hypoxemic respiratory failure ([place]) -    -Still with marked wheezing on exam, heard without needing stethoscope  -  Antibiotics antibiotics Rocephin and doxy continued -   Will continue with XopenexPRN with scheduled duoneb,  -  Breo or Dulera at discharge   - Worsening of acute hypoxic failure overnight noted. Patient now agreeable for IV access if area "numbed up." EMLA cream was ordered 5/24  . Tobacco abuse - -Tobacco cessation was done at time of admission -Continue with nicotine patch as tolerated -Remains stable currently  . Acute respiratory failure with hypoxia (HCC)  -Likely secondary to above COPD exacerbation -COVID testing neg -Worsened sob overnight per above. Once pt receives access, would start solumedrol. -Wheezing could be heard outside of room  . Hypercalcemia  - medically stable at this time -Repeat BMET had been ordered, however patient has been refusing blood draws  . Lung mass -vs  pneumonia  -Initially unable to tolerate CT at time of admission -Encouraged pt to undergo study. Will need IV access. Once patient has access, would follow up on CT  chest. Pending results, possible Pulm consult -On doxycycline and rocephin at time of admission  . Prolonged QT interval  -will monitor on tele avoid QT prolonging medications, rehydrate correct electrolytes as needed -Patient has been refusing labs over past several days   . Alcohol abuse  - Continue CIWA protocol - Without evidence of withdrawal at this time  DVT prophylaxis: SCD's Code Status: DNR Family Communication: Pt in room, family not at bedside Disposition Plan: Uncertain at this time  Consultants:     Procedures:     Antimicrobials: Anti-infectives (From admission, onward)   Start     Dose/Rate Route Frequency Ordered Stop   08/30/2018 2300  doxycycline (VIBRA-TABS) tablet 100 mg     100 mg Oral Every 12 hours 09/11/2018 2223     09/10/2018 2300  cefTRIAXone (ROCEPHIN) 1 g in sodium chloride 0.9 % 100 mL IVPB     1 g 200 mL/hr over 30 Minutes Intravenous Every 24 hours 09/16/2018 2223     09/14/2018 1530  amoxicillin (AMOXIL) capsule 500 mg     500 mg Oral  Once 09/03/2018 1520 09/25/2018 1529   09/16/2018 1530  azithromycin (ZITHROMAX) tablet 500 mg     500 mg Oral  Once 09/14/2018 1520 09/08/2018 1529   09/23/2018 1515  cefTRIAXone (ROCEPHIN) 1 g in sodium chloride 0.9 % 100 mL IVPB  Status:  Discontinued     1 g 200 mL/hr over 30 Minutes Intravenous  Once 09/03/2018 1512 09/20/2018 1520   09/05/2018 1515  azithromycin (  ZITHROMAX) tablet 500 mg  Status:  Discontinued     500 mg Oral  Once 09/01/2018 1512 09/19/2018 1520      Subjective: Markedly sob overnight, now agreeable to have IV access  Objective: Vitals:   09/20/18 0300 09/20/18 0811 09/20/18 0915 09/20/18 1211  BP: (!) 143/76  116/69 111/66  Pulse: 99  (!) 103 (!) 102  Resp: 18  20 16   Temp: 97.9 F (36.6 C)  98 F (36.7 C) 97.6 F (36.4 C)  TempSrc: Oral  Oral Oral  SpO2: 99% 100% 100% 100%  Weight:      Height:        Intake/Output Summary (Last 24 hours) at 09/20/2018 1333 Last data filed at 09/19/2018 1700  Gross per 24 hour  Intake 240 ml  Output 0 ml  Net 240 ml   Filed Weights   09/23/2018 1245 09/18/18 0453  Weight: 63.5 kg 47.8 kg    Examination: General exam: Awake, laying in bed, in nad Respiratory system: Increased resp effort, wheezing can be heard outside door Cardiovascular system: regular rate, s1, s2 Gastrointestinal system: Soft, nondistended, positive BS Central nervous system: CN2-12 grossly intact, strength intact Extremities: Perfused, no clubbing Skin: Normal skin turgor, no notable skin lesions seen Psychiatry: Mood normal // no visual hallucinations   Data Reviewed: I have personally reviewed following labs and imaging studies  CBC: Recent Labs  Lab 09/12/2018 1305 09/18/18 0801  WBC 14.2* 12.8*  NEUTROABS 9.4* 7.8*  HGB 12.5* 12.2*  HCT 39.3 37.2*  MCV 92.5 92.3  PLT 528* PLATELETS APPEAR ADEQUATE   Basic Metabolic Panel: Recent Labs  Lab 08/31/2018 1305  NA 138  K 3.6  CL 99  CO2 25  GLUCOSE 114*  BUN 6*  CREATININE 0.58*  CALCIUM 10.8*   GFR: Estimated Creatinine Clearance: 62.2 mL/min (A) (by C-G formula based on SCr of 0.58 mg/dL (L)). Liver Function Tests: No results for input(s): AST, ALT, ALKPHOS, BILITOT, PROT, ALBUMIN in the last 168 hours. No results for input(s): LIPASE, AMYLASE in the last 168 hours. No results for input(s): AMMONIA in the last 168 hours. Coagulation Profile: No results for input(s): INR, PROTIME in the last 168 hours. Cardiac Enzymes: Recent Labs  Lab 09/20/2018 1305  TROPONINI <0.03   BNP (last 3 results) No results for input(s): PROBNP in the last 8760 hours. HbA1C: No results for input(s): HGBA1C in the last 72 hours. CBG: No results for input(s): GLUCAP in the last 168 hours. Lipid Profile: No results for input(s): CHOL, HDL, LDLCALC, TRIG, CHOLHDL, LDLDIRECT in the last 72 hours. Thyroid Function Tests: No results for input(s): TSH, T4TOTAL, FREET4, T3FREE, THYROIDAB in the last 72 hours. Anemia  Panel: No results for input(s): VITAMINB12, FOLATE, FERRITIN, TIBC, IRON, RETICCTPCT in the last 72 hours. Sepsis Labs: No results for input(s): PROCALCITON, LATICACIDVEN in the last 168 hours.  Recent Results (from the past 240 hour(s))  SARS Coronavirus 2 (CEPHEID- Performed in [place] hospital lab), Hosp Order     Status: None   Collection Time: 09/16/2018  1:17 PM  Result Value Ref Range Status   SARS Coronavirus 2 NEGATIVE NEGATIVE Final    Comment: (NOTE) If result is NEGATIVE SARS-CoV-2 target nucleic acids are NOT DETECTED. The SARS-CoV-2 RNA is generally detectable in upper and lower  respiratory specimens during the acute phase of infection. The lowest  concentration of SARS-CoV-2 viral copies this assay can detect is 250  copies / mL. A negative result does not preclude SARS-CoV-2  infection  and should not be used as the sole basis for treatment or other  patient management decisions.  A negative result may occur with  improper specimen collection / handling, submission of specimen other  than nasopharyngeal swab, presence of viral mutation(s) within the  areas targeted by this assay, and inadequate number of viral copies  (<250 copies / mL). A negative result must be combined with clinical  observations, patient history, and epidemiological information. If result is POSITIVE SARS-CoV-2 target nucleic acids are DETECTED. The SARS-CoV-2 RNA is generally detectable in upper and lower  respiratory specimens dur ing the acute phase of infection.  Positive  results are indicative of active infection with SARS-CoV-2.  Clinical  correlation with patient history and other diagnostic information is  necessary to determine patient infection status.  Positive results do  not rule out bacterial infection or co-infection with other viruses. If result is PRESUMPTIVE POSTIVE SARS-CoV-2 nucleic acids MAY BE PRESENT.   A presumptive positive result was obtained on the submitted  specimen  and confirmed on repeat testing.  While 2019 novel coronavirus  (SARS-CoV-2) nucleic acids may be present in the submitted sample  additional confirmatory testing may be necessary for epidemiological  and / or clinical management purposes  to differentiate between  SARS-CoV-2 and other Sarbecovirus currently known to infect humans.  If clinically indicated additional testing with an alternate test  methodology [phone]) is advised. The SARS-CoV-2 RNA is generally  detectable in upper and lower respiratory sp ecimens during the acute  phase of infection. The expected result is Negative. Fact Sheet for Patients:  StrictlyIdeas.no Fact Sheet for Healthcare Providers: BankingDealers.co.za This test is not yet approved or cleared by the [place] FDA and has been authorized for detection and/or diagnosis of SARS-CoV-2 by FDA under an Emergency Use Authorization (EUA).  This EUA will remain in effect (meaning this test can be used) for the duration of the COVID-19 declaration under Section 564(b)(1) of the Act, 21 U.S.C. section 360bbb-3(b)(1), unless the authorization is terminated or revoked sooner. Performed at [facility], [place] [address] [place], [place] [address]   MRSA PCR Screening     Status: None   Collection Time: 09/23/2018 10:31 PM  Result Value Ref Range Status   MRSA by PCR NEGATIVE NEGATIVE Final    Comment:        The GeneXpert MRSA Assay (FDA approved for NASAL specimens only), is one component of a comprehensive MRSA colonization surveillance program. It is not intended to diagnose MRSA infection nor to guide or monitor treatment for MRSA infections. Performed at [facility], [place] [address], [place], [place] [address]      Radiology Studies: No results found.  Scheduled Meds: . doxycycline  100 mg Oral Q12H  . feeding supplement (ENSURE ENLIVE)  237 mL Oral TID BM  . folic acid  1 mg  Oral Daily  . ipratropium-albuterol  3 mL Nebulization TID  . mometasone-formoterol  1 puff Inhalation BID  . multivitamin with minerals  1 tablet Oral Daily  . nicotine  21 mg Transdermal Daily  . predniSONE  60 mg Oral Q breakfast  . sodium chloride flush  3 mL Intravenous Q12H  . thiamine  100 mg Oral Daily   Continuous Infusions: . sodium chloride    . cefTRIAXone (ROCEPHIN)  IV       LOS: 3 days   Marylu Lund, MD [facility] Pager On Amion  If 7PM-7AM, please contact night-coverage  09/20/2018, 1:33 PM

## 2018-09-20 NOTE — Progress Notes (Signed)
Per verbal read back by Dr. Wyline Copas placed order for Ativan 0.5 once prn for patient anxiety patient having procedure. RN will monitor.

## 2018-09-20 NOTE — Patient Care Conference (Signed)
Called and updated daughter at number [phone]. Number currently listed is old number and incorrect.

## 2018-09-20 NOTE — Progress Notes (Signed)
Patient refusing labs.

## 2018-09-21 ENCOUNTER — Inpatient Hospital Stay (HOSPITAL_COMMUNITY): Payer: Medicare Other

## 2018-09-21 DIAGNOSIS — I5030 Unspecified diastolic (congestive) heart failure: Secondary | ICD-10-CM

## 2018-09-21 LAB — URINE CULTURE: Culture: NO GROWTH

## 2018-09-21 LAB — ECHOCARDIOGRAM COMPLETE
Height: 69 in
Weight: 1686.08 oz

## 2018-09-21 MED ORDER — DEXTROSE 50 % IV SOLN
INTRAVENOUS | Status: AC
  Filled 2018-09-21: qty 50

## 2018-09-21 MED ORDER — COLLAGENASE 250 UNIT/GM EX OINT
TOPICAL_OINTMENT | Freq: Every day | CUTANEOUS | Status: DC
  Administered 2018-09-22 – 2018-09-23 (×2): 1 via TOPICAL
  Administered 2018-09-24 – 2018-09-26 (×3): via TOPICAL
  Filled 2018-09-21: qty 30

## 2018-09-21 NOTE — Progress Notes (Signed)
  Echocardiogram 2D Echocardiogram has been performed.  Anthony Dorsey 09/21/2018, 1:11 PM

## 2018-09-21 NOTE — Consult Note (Signed)
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  Present on admission.  Remote consult completed with assistance of bedside RN.  Wound type:unstageable pressure injury Pressure Injury POA: Yes Measurement: 3 cm x 3 cm unable to view wound bed due to the presence of eschar Wound bed:25% pale pink and 75% soft gray slough Drainage (amount, consistency, odor) minimal serosanguinous  No odor Periwound:blanchable erythema Dressing procedure/placement/frequency:Cleanse left hip wound with NS and pat dry.  Apply Santyl to wound bed.  Cover with NS moist 2x2.  Cover with foam dressing.  Change daily.  Will not follow at this time.  Please re-consult if needed.  Domenic Moras MSN, RN, FNP-BC CWON Wound, Ostomy, Continence Nurse Pager [phone]

## 2018-09-21 NOTE — Progress Notes (Signed)
PROGRESS NOTE    Anthony Dorsey  WUX:[id number] DOB: March 11, 1951 DOA: 09/23/2018 PCP: Patient, No Pcp Per    Brief Narrative:  68 y.o. male with medical history significant of tobacco abuse    Presented with 1 month of worsening dyspnea, he got tired of feeling like that and came in to ER. Reports wheezing ongoing tobacco abuse 1.5 pack/day. Symptom onset gradual nothing seems to make it better reports productive cough and occasional chest pain while coughing but no chest pain at baseline.  No fevers or chills.  Assessment & Plan:   Active Problems:   COPD with acute exacerbation (HCC)   Tobacco abuse   Acute respiratory failure with hypoxia (HCC)   Hypercalcemia   Lung mass   Prolonged QT interval   COPD exacerbation (HCC)   Alcohol abuse   Hemoptysis  . COPD with acute exacerbation and acute hypoxemic respiratory failure ([place]) -    -Still with marked wheezing on exam, heard without needing stethoscope  -  Patient now continued on rocephin and azithro, was not started initially because of lack of IV access -   Will continue with XopenexPRN with scheduled duoneb,  -  Breo or Dulera at discharge   - Pt since agreed to IV and now continued on IV solumedrol - O2 weaned to [facility], clinically seems improved this AM  . Tobacco abuse - -Tobacco cessation was done at time of admission -Continue with nicotine patch as tolerated -Remains stable at this time  . Acute respiratory failure with hypoxia (HCC)  -Likely secondary to above COPD exacerbation -COVID testing neg -Continues to wheeze, albeit improved since starting IV steroids  . Hypercalcemia  - medically stable at this time -repeat Ca within normal limits  . Lung mass -vs  pneumonia  -Initially unable to tolerate CT at time of admission -Patient had refused CT thus far, however now is agreeable for this afternoon -Patient did have roughly 3-5cc of hemoptysis with reported 40 pound wt loss over past month, giving  concern of malignancy  -Would follow up on CT chest. Pending result, may warrant formal Pulm eval  . Prolonged QT interval  -will monitor on tele avoid QT prolonging medications, rehydrate correct electrolytes as needed -Most recent QTc of 411 on recent EKG   . Alcohol abuse  - Continue CIWA protocol - Without evidence of withdrawal at this time  BLE edema -Patient is continued on empiric lasix given recent hypoxemic failure -BLE edema noted on exam -2d echo is pending to r/o underlying CHF  DVT prophylaxis: SCD's Code Status: DNR Family Communication: Pt in room, family not at bedside Disposition Plan: Uncertain at this time  Consultants:     Procedures:     Antimicrobials: Anti-infectives (From admission, onward)   Start     Dose/Rate Route Frequency Ordered Stop   09/20/18 1830  azithromycin (ZITHROMAX) 500 mg in sodium chloride 0.9 % 250 mL IVPB     500 mg 250 mL/hr over 60 Minutes Intravenous Every 24 hours 09/20/18 1714 2018/10/01 1829   09/20/18 1800  cefTRIAXone (ROCEPHIN) 1 g in sodium chloride 0.9 % 100 mL IVPB     1 g 200 mL/hr over 30 Minutes Intravenous Every 24 hours 09/20/18 1714 10-01-2018 1759   08/31/2018 2300  doxycycline (VIBRA-TABS) tablet 100 mg  Status:  Discontinued     100 mg Oral Every 12 hours 08/31/2018 2223 09/20/18 1714   08/27/2018 2300  cefTRIAXone (ROCEPHIN) 1 g in sodium chloride 0.9 % 100 mL  IVPB  Status:  Discontinued     1 g 200 mL/hr over 30 Minutes Intravenous Every 24 hours 09/16/2018 2223 09/20/18 1714   09/21/2018 1530  amoxicillin (AMOXIL) capsule 500 mg     500 mg Oral  Once 08/31/2018 1520 08/28/2018 1529   09/21/2018 1530  azithromycin (ZITHROMAX) tablet 500 mg     500 mg Oral  Once 09/03/2018 1520 09/08/2018 1529   09/06/2018 1515  cefTRIAXone (ROCEPHIN) 1 g in sodium chloride 0.9 % 100 mL IVPB  Status:  Discontinued     1 g 200 mL/hr over 30 Minutes Intravenous  Once 08/29/2018 1512 08/27/2018 1520   09/01/2018 1515  azithromycin (ZITHROMAX) tablet  500 mg  Status:  Discontinued     500 mg Oral  Once 09/26/2018 1512 08/29/2018 1520      Subjective: States feeling better now  Objective: Vitals:   09/21/18 0735 09/21/18 0850 09/21/18 0851 09/21/18 1218  BP: 105/76   104/75  Pulse: (!) 106   (!) 115  Resp: 20   20  Temp: 98 F (36.7 C)   (!) 97 F (36.1 C)  TempSrc: Oral   Axillary  SpO2: 100% 98% 98% 98%  Weight:      Height:        Intake/Output Summary (Last 24 hours) at 09/21/2018 1225 Last data filed at 09/21/2018 1221 Gross per 24 hour  Intake 1235 ml  Output 2475 ml  Net -1240 ml   Filed Weights   09/14/2018 1245 09/18/18 0453  Weight: 63.5 kg 47.8 kg    Examination: General exam: Conversant, in no acute distress Respiratory system: normal chest rise, clear, wheezing on exam Cardiovascular system: regular rhythm, s1-s2 Gastrointestinal system: Nondistended, nontender, pos BS Central nervous system: No seizures, no tremors Extremities: No cyanosis, no joint deformities, BLE edema Skin: No rashes, no pallor Psychiatry: Affect normal // no auditory hallucinations   Data Reviewed: I have personally reviewed following labs and imaging studies  CBC: Recent Labs  Lab 09/26/2018 1305 09/18/18 0801 09/20/18 1410  WBC 14.2* 12.8* 9.0  NEUTROABS 9.4* 7.8*  --   HGB 12.5* 12.2* 10.9*  HCT 39.3 37.2* 34.4*  MCV 92.5 92.3 93.2  PLT 528* PLATELETS APPEAR ADEQUATE 527*   Basic Metabolic Panel: Recent Labs  Lab 08/27/2018 1305 09/20/18 1410  NA 138 135  K 3.6 3.9  CL 99 96*  CO2 25 33*  GLUCOSE 114* 124*  BUN 6* 11  CREATININE 0.58* 0.50*  CALCIUM 10.8* 10.3   GFR: Estimated Creatinine Clearance: 62.2 mL/min (A) (by C-G formula based on SCr of 0.5 mg/dL (L)). Liver Function Tests: No results for input(s): AST, ALT, ALKPHOS, BILITOT, PROT, ALBUMIN in the last 168 hours. No results for input(s): LIPASE, AMYLASE in the last 168 hours. No results for input(s): AMMONIA in the last 168 hours. Coagulation Profile:  No results for input(s): INR, PROTIME in the last 168 hours. Cardiac Enzymes: Recent Labs  Lab 09/15/2018 1305  TROPONINI <0.03   BNP (last 3 results) No results for input(s): PROBNP in the last 8760 hours. HbA1C: No results for input(s): HGBA1C in the last 72 hours. CBG: No results for input(s): GLUCAP in the last 168 hours. Lipid Profile: No results for input(s): CHOL, HDL, LDLCALC, TRIG, CHOLHDL, LDLDIRECT in the last 72 hours. Thyroid Function Tests: No results for input(s): TSH, T4TOTAL, FREET4, T3FREE, THYROIDAB in the last 72 hours. Anemia Panel: No results for input(s): VITAMINB12, FOLATE, FERRITIN, TIBC, IRON, RETICCTPCT in the last 72 hours.  Sepsis Labs: No results for input(s): PROCALCITON, LATICACIDVEN in the last 168 hours.  Recent Results (from the past 240 hour(s))  SARS Coronavirus 2 (CEPHEID- Performed in [facility] hospital lab), Hosp Order     Status: None   Collection Time: 09/06/2018  1:17 PM  Result Value Ref Range Status   SARS Coronavirus 2 NEGATIVE NEGATIVE Final    Comment: (NOTE) If result is NEGATIVE SARS-CoV-2 target nucleic acids are NOT DETECTED. The SARS-CoV-2 RNA is generally detectable in upper and lower  respiratory specimens during the acute phase of infection. The lowest  concentration of SARS-CoV-2 viral copies this assay can detect is 250  copies / mL. A negative result does not preclude SARS-CoV-2 infection  and should not be used as the sole basis for treatment or other  patient management decisions.  A negative result may occur with  improper specimen collection / handling, submission of specimen other  than nasopharyngeal swab, presence of viral mutation(s) within the  areas targeted by this assay, and inadequate number of viral copies  (<250 copies / mL). A negative result must be combined with clinical  observations, patient history, and epidemiological information. If result is POSITIVE SARS-CoV-2 target nucleic acids are DETECTED.  The SARS-CoV-2 RNA is generally detectable in upper and lower  respiratory specimens dur ing the acute phase of infection.  Positive  results are indicative of active infection with SARS-CoV-2.  Clinical  correlation with patient history and other diagnostic information is  necessary to determine patient infection status.  Positive results do  not rule out bacterial infection or co-infection with other viruses. If result is PRESUMPTIVE POSTIVE SARS-CoV-2 nucleic acids MAY BE PRESENT.   A presumptive positive result was obtained on the submitted specimen  and confirmed on repeat testing.  While 2019 novel coronavirus  (SARS-CoV-2) nucleic acids may be present in the submitted sample  additional confirmatory testing may be necessary for epidemiological  and / or clinical management purposes  to differentiate between  SARS-CoV-2 and other Sarbecovirus currently known to infect humans.  If clinically indicated additional testing with an alternate test  methodology [phone]) is advised. The SARS-CoV-2 RNA is generally  detectable in upper and lower respiratory sp ecimens during the acute  phase of infection. The expected result is Negative. Fact Sheet for Patients:  StrictlyIdeas.no Fact Sheet for Healthcare Providers: BankingDealers.co.za This test is not yet approved or cleared by the [place] FDA and has been authorized for detection and/or diagnosis of SARS-CoV-2 by FDA under an Emergency Use Authorization (EUA).  This EUA will remain in effect (meaning this test can be used) for the duration of the COVID-19 declaration under Section 564(b)(1) of the Act, 21 U.S.C. section 360bbb-3(b)(1), unless the authorization is terminated or revoked sooner. Performed at [facility], [place] [address], [place], [place] [address]   MRSA PCR Screening     Status: None   Collection Time: 09/02/2018 10:31 PM  Result Value Ref Range Status    MRSA by PCR NEGATIVE NEGATIVE Final    Comment:        The GeneXpert MRSA Assay (FDA approved for NASAL specimens only), is one component of a comprehensive MRSA colonization surveillance program. It is not intended to diagnose MRSA infection nor to guide or monitor treatment for MRSA infections. Performed at [facility], [place] [address], [place], [place] [address]      Radiology Studies: Dg Chest Port 1 View  Result Date: 09/20/2018 CLINICAL DATA:  Hypoxia  with difficulty breathing and wheezing. History of COPD and lung mass. EXAM: PORTABLE CHEST 1 VIEW COMPARISON:  Two-view chest 09/03/2018. No other comparison studies. FINDINGS: 1640 hours. The heart size is normal. There is persistent right paratracheal soft tissue thickening suspicious for a mass. The trachea is mildly deviated to the left. Emphysematous changes are present with right apical blebs. No peripheral lung mass, consolidation, pleural effusion or pneumothorax. The bones appear unchanged. Telemetry leads overlie the chest. IMPRESSION: Stable appearance of the chest with persistent concern of a right paratracheal mass or adenopathy. Chest CT recommended to evaluate for possible malignancy. Electronically Signed   By: Richardean Sale M.D.   On: 09/20/2018 17:13    Scheduled Meds: . collagenase   Topical Daily  . feeding supplement (ENSURE ENLIVE)  237 mL Oral TID BM  . folic acid  1 mg Oral Daily  . furosemide  40 mg Intravenous BID  . ipratropium-albuterol  3 mL Nebulization TID  . methylPREDNISolone (SOLU-MEDROL) injection  40 mg Intravenous Q12H  . mometasone-formoterol  1 puff Inhalation BID  . multivitamin with minerals  1 tablet Oral Daily  . nicotine  21 mg Transdermal Daily  . sodium chloride flush  10-40 mL Intracatheter Q12H  . sodium chloride flush  3 mL Intravenous Q12H  . thiamine  100 mg Oral Daily   Continuous Infusions: . sodium chloride 75 mL/hr at 09/20/18 1356  . azithromycin 500 mg (09/20/18  2023)  . cefTRIAXone (ROCEPHIN)  IV 1 g (09/20/18 1950)     LOS: 4 days   Marylu Lund, MD [facility] Pager On Amion  If 7PM-7AM, please contact night-coverage 09/21/2018, 12:25 PM

## 2018-09-22 LAB — BASIC METABOLIC PANEL
Anion gap: 12 (ref 5–15)
BUN: 21 mg/dL (ref 8–23)
CO2: 33 mmol/L — ABNORMAL HIGH (ref 22–32)
Calcium: 10.7 mg/dL — ABNORMAL HIGH (ref 8.9–10.3)
Chloride: 96 mmol/L — ABNORMAL LOW (ref 98–111)
Creatinine, Ser: 0.59 mg/dL — ABNORMAL LOW (ref 0.61–1.24)
GFR calc Af Amer: >60 mL/min (ref >60)
GFR calc non Af Amer: >60 mL/min (ref >60)
Glucose, Bld: 138 mg/dL — ABNORMAL HIGH (ref 70–99)
Potassium: 3.9 mmol/L (ref 3.5–5.1)
Sodium: 141 mmol/L (ref 135–145)

## 2018-09-22 LAB — CBC
HCT: 34.5 % — ABNORMAL LOW (ref 39.0–52.0)
Hemoglobin: 10.8 g/dL — ABNORMAL LOW (ref 13.0–17.0)
MCH: 29.1 pg (ref 26.0–34.0)
MCHC: 31.3 g/dL (ref 30.0–36.0)
MCV: 93 fL (ref 80.0–100.0)
Platelets: 488 10*3/uL — ABNORMAL HIGH (ref 150–400)
RBC: 3.71 MIL/uL — ABNORMAL LOW (ref 4.22–5.81)
RDW: 16.1 % — ABNORMAL HIGH (ref 11.5–15.5)
WBC: 12.8 10*3/uL — ABNORMAL HIGH (ref 4.0–10.5)
nRBC: 0 % (ref 0.0–0.2)

## 2018-09-22 NOTE — Progress Notes (Signed)
Nutrition Follow-up  RD working remotely.  DOCUMENTATION CODES:   Underweight, suspect some degree of malnutrition but unable to confirm at this time without NFPE  INTERVENTION:   - Continue Ensure Enlive po TID, each supplement provides 350 kcal and 20 grams of protein  - Add Magic Cup BID with lunch and dinner meals, each supplement provides 290 kcal and 9 grams of protein  - MVI with minerals daily  NUTRITION DIAGNOSIS:   Inadequate oral intake related to poor appetite as evidenced by per patient/family report.  Progressing, being addressed via oral nutrition supplements  GOAL:   Patient will meet greater than or equal to 90% of their needs  Progressing  MONITOR:   Supplement acceptance, PO intake, Labs, Weight trends  REASON FOR ASSESSMENT:   Malnutrition Screening Tool    ASSESSMENT:   68 year old male who presented to the ED on 5/22 with SOB and hemoptysis. PMH significant for tobacco abuse. Chest x-ray that showed evidence of consolidation noted to the medial right upper lobe concerning for mass versus consolidation. Pt admitted with COPD exacerbation. Concern for malignancy.  Noted plan for CT chest today. However, per MD note, pt has declined. Palliative care team consult this AM to establish [place].  Noted pt had roughly 3-5 cc of hemoptysis yesterday.  Reviewed RN edema assessment. Pt with mild pitting edema to BLE.  No new weights since admit.  Spoke with pt via phone call to room. Pt in good spirits, some wheezing during conversation. Pt reports that his appetite is good and continues to improve. Pt states he "loves" the Ensure Enlive supplements and drinks them everyday. Pt states, "keep them coming!"  RD encouraged continued PO intake and supplement intake. Pt states he believes he is eating well. RD will also add Magic Cup with meals to aid pt in meeting increased kcal and protien needs.  Meal Completion: 50-100% x last 7 recorded meals (averaging  81%)  Medications reviewed and include: Ensure Enlive TID, folic acid, Lasix, Solu-medrol, MVI with minerals, thiamine, IV abx  Labs reviewed: calcium 10.7 (H)  UOP: 1925 ml x 24 hours  Diet Order:   Diet Order            Diet Heart Room service appropriate? Yes; Fluid consistency: Thin  Diet effective now              EDUCATION NEEDS:   Education needs have been addressed  Skin:  Skin Assessment: Reviewed RN Assessment  Last BM:  09/22/18  Height:   Ht Readings from Last 1 Encounters:  09/13/2018 5\' 9"  (1.753 m)    Weight:   Wt Readings from Last 1 Encounters:  09/18/18 47.8 kg    Ideal Body Weight:  72.7 kg  BMI:  Body mass index is 15.56 kg/m.  Estimated Nutritional Needs:   Kcal:  1700-1900  Protein:  75-90 grams  Fluid:  >/= 1.7 L    Gaynell Face, MS, RD, LDN Inpatient Clinical Dietitian Pager: [phone] Weekend/After Hours: [phone]

## 2018-09-22 NOTE — Progress Notes (Signed)
PROGRESS NOTE  Anthony Dorsey UXL:[id number] DOB: 1951-02-10 DOA: 09/10/2018 PCP: Patient, No Pcp Per  HPI/Recap of past 24 hours: 68 y.o.malewith medical history significant of tobacco abuse Presented with 1 month of worsening dyspnea, he got tired of feeling like that and came in to ER. Reports wheezing ongoing tobacco abuse 1.5 pack/day. Symptom onset gradual nothing seems to make it better reports productive cough and occasional chest pain while coughing but no chest pain at baseline. No fevers or chills.  Chest x-ray done on 09/20/2018 with concern for right paratracheal mass or adenopathy.  Chest CT recommended but patient declines multiple times.  09/22/18: Patient seen and examined at his bedside.  Reports persistent nonproductive cough.  He denies chest pain.  Declines chest CT to evaluate for possible paratracheal mass or adenopathy.  He is alert and oriented x3.  Palliative care consulted to establish goals of care.  Assessment/Plan: Active Problems:   COPD with acute exacerbation (HCC)   Tobacco abuse   Acute respiratory failure with hypoxia (HCC)   Hypercalcemia   Lung mass   Prolonged QT interval   COPD exacerbation (HCC)   Alcohol abuse   Hemoptysis  . COPD with acute exacerbation and acute hypoxemic respiratory failure ([place]) -  -Still with marked wheezing on exam, heard without needing stethoscope - Patient now continued on rocephin and azithro, was not started initially because of lack of IV access - Will continue with XopenexPRN with scheduled duoneb, - Breo or Dulera at discharge - Pt since agreed to IV and now continued on IV solumedrol -Maintain O2 saturation greater than 90%  . Tobacco abuse - -Tobacco cessation was done at time of admission -Continue with nicotine patch as tolerated -Remains stable at this time  . Acute respiratory failure with hypoxia (HCC)  -Likely secondary to above COPD exacerbation -COVID testing neg -Continues  to wheeze, albeit improved since starting IV steroids -Continue nebs  . Hypercalcemia  - medically stable at this time -Calcium 10.7 Continue IV fluid with normal saline at 75 cc/h Monitor urine output  . Lung mass -vs pneumonia  -Initially unable to tolerate CT at time of admission -Patient had refused CT thus far -Patient did have roughly 3-5cc of hemoptysis yesterday with reported 40 pound wt loss over past month, giving concern of malignancy  -Patient continues to decline CT chest -Palliative care team consulted to establish goals of care  .  Resolved prolonged QT interval  -will monitor on tele avoid QT prolonging medications, rehydrate correct electrolytes as needed -Most recent QTc of 411 on recent EKG  . Alcohol abuse  - Continue CIWA protocol - Without evidence of withdrawal at this time  BLE edema -Patient is continued on empiric lasix given recent hypoxemic failure -BLE edema noted on exam -Denies tenderness on palpation -2d echo done on 09/21/2018 shows normal LVEF    DVT prophylaxis: SCD's Code Status: DNR Family Communication: Pt in room, family not at bedside Disposition Plan: Uncertain at this time  Consultants:     Procedures:       Objective: Vitals:   09/22/18 0735 09/22/18 0740 09/22/18 0754 09/22/18 1131  BP:   (!) 104/55 91/72  Pulse:    (!) 113  Resp:   17 17  Temp:   97.8 F (36.6 C) 98.1 F (36.7 C)  TempSrc:   Oral Oral  SpO2: 99% 99%  96%  Weight:      Height:        Intake/Output Summary (Last  24 hours) at 09/22/2018 1313 Last data filed at 09/22/2018 0800 Gross per 24 hour  Intake 873.5 ml  Output 1125 ml  Net -251.5 ml   Filed Weights   09/16/2018 1245 09/18/18 0453  Weight: 63.5 kg 47.8 kg    Exam:  . General: 68 y.o. year-old male emaciated and chronically ill-appearing with conversational dyspnea.  Alert and oriented x3. .  Cardiovascular: Regular rate and rhythm with no rubs or gallops.  No  thyromegaly or JVD noted.   Marland Kitchen Respiratory: Mild rales at bases with diffuse wheezing.  Poor inspiratory effort. . Abdomen: Soft nontender nondistended with normal bowel sounds x4 quadrants. . Musculoskeletal: Bilateral lower extremity edema right greater than left.  Nontender on palpation. . Skin: Very dry skin affecting lower extremities. Marland Kitchen Psychiatry: Mood is irritable.   Data Reviewed: CBC: Recent Labs  Lab 09/07/2018 1305 09/18/18 0801 09/20/18 1410 09/22/18 0227  WBC 14.2* 12.8* 9.0 12.8*  NEUTROABS 9.4* 7.8*  --   --   HGB 12.5* 12.2* 10.9* 10.8*  HCT 39.3 37.2* 34.4* 34.5*  MCV 92.5 92.3 93.2 93.0  PLT 528* PLATELETS APPEAR ADEQUATE 477* 342*   Basic Metabolic Panel: Recent Labs  Lab 09/14/2018 1305 09/20/18 1410 09/22/18 0227  NA 138 135 141  K 3.6 3.9 3.9  CL 99 96* 96*  CO2 25 33* 33*  GLUCOSE 114* 124* 138*  BUN 6* 11 21  CREATININE 0.58* 0.50* 0.59*  CALCIUM 10.8* 10.3 10.7*   GFR: Estimated Creatinine Clearance: 62.2 mL/min (A) (by C-G formula based on SCr of 0.59 mg/dL (L)). Liver Function Tests: No results for input(s): AST, ALT, ALKPHOS, BILITOT, PROT, ALBUMIN in the last 168 hours. No results for input(s): LIPASE, AMYLASE in the last 168 hours. No results for input(s): AMMONIA in the last 168 hours. Coagulation Profile: No results for input(s): INR, PROTIME in the last 168 hours. Cardiac Enzymes: Recent Labs  Lab 09/08/2018 1305  TROPONINI <0.03   BNP (last 3 results) No results for input(s): PROBNP in the last 8760 hours. HbA1C: No results for input(s): HGBA1C in the last 72 hours. CBG: No results for input(s): GLUCAP in the last 168 hours. Lipid Profile: No results for input(s): CHOL, HDL, LDLCALC, TRIG, CHOLHDL, LDLDIRECT in the last 72 hours. Thyroid Function Tests: No results for input(s): TSH, T4TOTAL, FREET4, T3FREE, THYROIDAB in the last 72 hours. Anemia Panel: No results for input(s): VITAMINB12, FOLATE, FERRITIN, TIBC, IRON,  RETICCTPCT in the last 72 hours. Urine analysis: No results found for: COLORURINE, APPEARANCEUR, LABSPEC, PHURINE, GLUCOSEU, HGBUR, BILIRUBINUR, KETONESUR, PROTEINUR, UROBILINOGEN, NITRITE, LEUKOCYTESUR Sepsis Labs: @LABRCNTIP (procalcitonin:4,lacticidven:4)  ) Recent Results (from the past 240 hour(s))  SARS Coronavirus 2 (CEPHEID- Performed in [place] hospital lab), Hosp Order     Status: None   Collection Time: 09/23/2018  1:17 PM  Result Value Ref Range Status   SARS Coronavirus 2 NEGATIVE NEGATIVE Final    Comment: (NOTE) If result is NEGATIVE SARS-CoV-2 target nucleic acids are NOT DETECTED. The SARS-CoV-2 RNA is generally detectable in upper and lower  respiratory specimens during the acute phase of infection. The lowest  concentration of SARS-CoV-2 viral copies this assay can detect is 250  copies / mL. A negative result does not preclude SARS-CoV-2 infection  and should not be used as the sole basis for treatment or other  patient management decisions.  A negative result may occur with  improper specimen collection / handling, submission of specimen other  than nasopharyngeal swab, presence of viral mutation(s) within  the  areas targeted by this assay, and inadequate number of viral copies  (<250 copies / mL). A negative result must be combined with clinical  observations, patient history, and epidemiological information. If result is POSITIVE SARS-CoV-2 target nucleic acids are DETECTED. The SARS-CoV-2 RNA is generally detectable in upper and lower  respiratory specimens dur ing the acute phase of infection.  Positive  results are indicative of active infection with SARS-CoV-2.  Clinical  correlation with patient history and other diagnostic information is  necessary to determine patient infection status.  Positive results do  not rule out bacterial infection or co-infection with other viruses. If result is PRESUMPTIVE POSTIVE SARS-CoV-2 nucleic acids MAY BE PRESENT.    A presumptive positive result was obtained on the submitted specimen  and confirmed on repeat testing.  While 2019 novel coronavirus  (SARS-CoV-2) nucleic acids may be present in the submitted sample  additional confirmatory testing may be necessary for epidemiological  and / or clinical management purposes  to differentiate between  SARS-CoV-2 and other Sarbecovirus currently known to infect humans.  If clinically indicated additional testing with an alternate test  methodology [id number]) is advised. The SARS-CoV-2 RNA is generally  detectable in upper and lower respiratory sp ecimens during the acute  phase of infection. The expected result is Negative. Fact Sheet for Patients:  StrictlyIdeas.no Fact Sheet for Healthcare Providers: BankingDealers.co.za This test is not yet approved or cleared by the [place] FDA and has been authorized for detection and/or diagnosis of SARS-CoV-2 by FDA under an Emergency Use Authorization (EUA).  This EUA will remain in effect (meaning this test can be used) for the duration of the COVID-19 declaration under Section 564(b)(1) of the Act, 21 U.S.C. section 360bbb-3(b)(1), unless the authorization is terminated or revoked sooner. Performed at [facility], [place] [address], [place], [place] [address]   MRSA PCR Screening     Status: None   Collection Time: 09/21/2018 10:31 PM  Result Value Ref Range Status   MRSA by PCR NEGATIVE NEGATIVE Final    Comment:        The GeneXpert MRSA Assay (FDA approved for NASAL specimens only), is one component of a comprehensive MRSA colonization surveillance program. It is not intended to diagnose MRSA infection nor to guide or monitor treatment for MRSA infections. Performed at [facility], [place] [address] [place], [place] [address]   Culture, Urine     Status: None   Collection Time: 09/20/18 10:09 PM  Result Value Ref Range Status   Specimen  Description URINE, RANDOM  Final   Special Requests NONE  Final   Culture   Final    NO GROWTH Performed at [facility], [place] [address], [place],  [address]    Report Status 09/21/2018 FINAL  Final      Studies: No results found.  Scheduled Meds: . collagenase   Topical Daily  . feeding supplement (ENSURE ENLIVE)  237 mL Oral TID BM  . folic acid  1 mg Oral Daily  . furosemide  40 mg Intravenous BID  . ipratropium-albuterol  3 mL Nebulization TID  . methylPREDNISolone (SOLU-MEDROL) injection  40 mg Intravenous Q12H  . mometasone-formoterol  1 puff Inhalation BID  . multivitamin with minerals  1 tablet Oral Daily  . sodium chloride flush  10-40 mL Intracatheter Q12H  . sodium chloride flush  3 mL Intravenous Q12H  . thiamine  100 mg Oral Daily    Continuous Infusions: .  sodium chloride Stopped (09/20/18 1605)  . azithromycin Stopped (09/21/18 2003)  . cefTRIAXone (ROCEPHIN)  IV Stopped (09/21/18 1844)     LOS: 5 days     Kayleen Memos, MD [facility] Pager [phone]  If 7PM-7AM, please contact night-coverage [email or web address] Password [email or web address] 09/22/2018, 1:13 PM

## 2018-09-23 DIAGNOSIS — Z66 Do not resuscitate: Secondary | ICD-10-CM

## 2018-09-23 DIAGNOSIS — Z7189 Other specified counseling: Secondary | ICD-10-CM

## 2018-09-23 DIAGNOSIS — Z515 Encounter for palliative care: Secondary | ICD-10-CM

## 2018-09-23 LAB — GLUCOSE, CAPILLARY: Glucose-Capillary: 111 mg/dL — ABNORMAL HIGH (ref 70–99)

## 2018-09-23 MED ORDER — LORAZEPAM 2 MG/ML IJ SOLN
0.5000 mg | Freq: Once | INTRAMUSCULAR | Status: AC | PRN
  Administered 2018-09-25: 02:00:00 0.5 mg via INTRAVENOUS
  Filled 2018-09-23: qty 1

## 2018-09-23 MED ORDER — RACEPINEPHRINE HCL 2.25 % IN NEBU
0.5000 mL | INHALATION_SOLUTION | RESPIRATORY_TRACT | Status: DC | PRN
  Administered 2018-09-23 – 2018-09-27 (×4): 0.5 mL via RESPIRATORY_TRACT
  Filled 2018-09-23 (×6): qty 0.5

## 2018-09-23 NOTE — Progress Notes (Signed)
PT Cancellation Note  Patient Details Name: Anthony Dorsey MRN: [id number] DOB: 1950/10/10   Cancelled Treatment:    Reason Eval/Treat Not Completed: Patient declined, no reason specified.  Pt declined after his episode of respiratory distress.  Will try tomorrow. 09/23/2018  Anthony Dorsey, [facility] [phone]  (pager) [phone]  (office)   Anthony Dorsey 09/23/2018, 5:46 PM

## 2018-09-23 NOTE — Care Management Important Message (Signed)
Important Message  Patient Details  Name: Anthony Dorsey MRN: [id number] Date of Birth: June 13, 1950   Medicare Important Message Given:  Yes    Orbie Pyo 09/23/2018, 2:15 PM

## 2018-09-23 NOTE — Progress Notes (Signed)
PROGRESS NOTE  Anthony KNEE Dorsey:[id number] DOB: 10-11-1950 DOA: 09/16/2018 PCP: Patient, No Pcp Per  HPI/Recap of past 24 hours: 68 y.o.malewith medical history significant of tobacco abuse Presented with 1 month of worsening dyspnea, he got tired of feeling like that and came in to ER. Reports wheezing ongoing tobacco abuse 1.5 pack/day. Symptom onset gradual nothing seems to make it better reports productive cough and occasional chest pain while coughing but no chest pain at baseline. No fevers or chills.  Chest x-ray done on 09/20/2018 with concern for right paratracheal mass or adenopathy.  Chest CT recommended but patient declines multiple times.  09/22/18: Declines chest CT to evaluate for possible paratracheal mass or adenopathy.  He is alert and oriented x3.  Palliative care consulted to establish goals of care.  09/23/18: Patient was seen and examined at bedside.  Persistent audible wheezing noted.  Started on racemic epi.  Continues to decline chest CT.  Admits to intermittent nonproductive cough.  Assessment/Plan: Active Problems:   COPD with acute exacerbation (HCC)   Tobacco abuse   Acute respiratory failure with hypoxia (HCC)   Hypercalcemia   Lung mass   Prolonged QT interval   COPD exacerbation (HCC)   Alcohol abuse   Hemoptysis  . COPD with acute exacerbation and acute hypoxemic respiratory failure ([place]) -  -Still with marked wheezing on exam, heard without needing stethoscope -Started racemic epi every 4 hours PRN for wheezing - Continue rocephin and azithro - Will continue with XopenexPRN with scheduled duoneb - Breo or Dulera at discharge - Pt since agreed to IV and now continued on IV solumedrol -Maintain O2 saturation greater than 90% -Continues to decline chest CT  Acute hypoxic respiratory failure likely multifactorial secondary to COPD exacerbation versus possible lung mass Currently requiring 5 L oxygen by nasal cannula to maintain O2  saturation greater than 90% COVID-19 negative Management stated above  Medical noncompliance Patient is alert and oriented x3 Declined chest CT Initially declined IV placement  . Tobacco abuse - -Tobacco cessation was done at time of admission -Continue with nicotine patch as tolerated -Remains stable at this time  . Hypercalcemia  - medically stable at this time -Calcium 10.7 Continue IV fluid with normal saline at 75 cc/h Monitor urine output Repeat BMP in the morning  . Lung mass -vs community-acquired pneumonia  -Patient had refused CT chest thus far Intermittent hemoptysis reported On Rocephin and azithromycin Monitor WBC Repeat CBC in the morning with differential -Palliative care team consulted to establish goals of care  .  Resolved prolonged QT interval  -will monitor on tele avoid QT prolonging medications, rehydrate correct electrolytes as needed -Most recent QTc of 411 on recent EKG  . Alcohol abuse with concern for withdrawal - Continue CIWA protocol - Without evidence of withdrawal at this time  BLE edema -Patient is continued on empiric lasix given recent hypoxemic failure -BLE edema noted on exam -Denies tenderness on palpation -2d echo done on 09/21/2018 shows normal LVEF  Ambulatory dysfunction/physical debility Has declined occupational therapy while inpatient  Severe protein calorie malnutrition BMI 15 Encourage increase protein oral calorie intake Over supplement Obtain CMP mag and phosphate    DVT prophylaxis: SCD's Code Status: DNR Family Communication: Pt in room, family not at bedside Disposition Plan:  Possibly in 1 to 2 days when clinically improved  Consultants:   Palliative care team  Procedures:    None   Objective: Vitals:   09/23/18 5397 09/23/18 0421 09/23/18 6734 09/23/18  1126  BP: 104/66  94/65 106/82  Pulse: 96  (!) 101 (!) 106  Resp: 11  17 18   Temp: 97.9 F (36.6 C)  97.9 F (36.6 C) (!) 97.4  F (36.3 C)  TempSrc: Oral  Oral Oral  SpO2: 100% 98% 99% 94%  Weight:      Height:        Intake/Output Summary (Last 24 hours) at 09/23/2018 1503 Last data filed at 09/23/2018 0900 Gross per 24 hour  Intake 1883.92 ml  Output -  Net 1883.92 ml   Filed Weights   08/29/2018 1245 09/18/18 0453  Weight: 63.5 kg 47.8 kg    Exam:  . General: 68 y.o. year-old male Emaciated and chronically ill-appearing.  Conversational dyspnea noted with audible wheezing.   . Cardiovascular: Regular rate and rhythm with no rubs or gallops.  No JVD or thyromegaly noted.   Marland Kitchen Respiratory: Audible wheezing and mild rales noted at bases.  Poor inspiratory effort.   . Abdomen: Soft nontender nondistended with normal bowel sounds x4 quadrants.   . Musculoskeletal: Bilateral lower extremity edema right greater than left.  . Skin: Very dry skin in lower extremities. Marland Kitchen Psychiatry: Mood is irritable.   Data Reviewed: CBC: Recent Labs  Lab 09/06/2018 1305 09/18/18 0801 09/20/18 1410 09/22/18 0227  WBC 14.2* 12.8* 9.0 12.8*  NEUTROABS 9.4* 7.8*  --   --   HGB 12.5* 12.2* 10.9* 10.8*  HCT 39.3 37.2* 34.4* 34.5*  MCV 92.5 92.3 93.2 93.0  PLT 528* PLATELETS APPEAR ADEQUATE 477* 573*   Basic Metabolic Panel: Recent Labs  Lab 09/13/2018 1305 09/20/18 1410 09/22/18 0227  NA 138 135 141  K 3.6 3.9 3.9  CL 99 96* 96*  CO2 25 33* 33*  GLUCOSE 114* 124* 138*  BUN 6* 11 21  CREATININE 0.58* 0.50* 0.59*  CALCIUM 10.8* 10.3 10.7*   GFR: Estimated Creatinine Clearance: 62.2 mL/min (A) (by C-G formula based on SCr of 0.59 mg/dL (L)). Liver Function Tests: No results for input(s): AST, ALT, ALKPHOS, BILITOT, PROT, ALBUMIN in the last 168 hours. No results for input(s): LIPASE, AMYLASE in the last 168 hours. No results for input(s): AMMONIA in the last 168 hours. Coagulation Profile: No results for input(s): INR, PROTIME in the last 168 hours. Cardiac Enzymes: Recent Labs  Lab 08/27/2018 1305  TROPONINI  <0.03   BNP (last 3 results) No results for input(s): PROBNP in the last 8760 hours. HbA1C: No results for input(s): HGBA1C in the last 72 hours. CBG: Recent Labs  Lab 09/23/18 1111  GLUCAP 111*   Lipid Profile: No results for input(s): CHOL, HDL, LDLCALC, TRIG, CHOLHDL, LDLDIRECT in the last 72 hours. Thyroid Function Tests: No results for input(s): TSH, T4TOTAL, FREET4, T3FREE, THYROIDAB in the last 72 hours. Anemia Panel: No results for input(s): VITAMINB12, FOLATE, FERRITIN, TIBC, IRON, RETICCTPCT in the last 72 hours. Urine analysis: No results found for: COLORURINE, APPEARANCEUR, LABSPEC, PHURINE, GLUCOSEU, HGBUR, BILIRUBINUR, KETONESUR, PROTEINUR, UROBILINOGEN, NITRITE, LEUKOCYTESUR Sepsis Labs: @LABRCNTIP (procalcitonin:4,lacticidven:4)  ) Recent Results (from the past 240 hour(s))  SARS Coronavirus 2 (CEPHEID- Performed in [facility] hospital lab), Hosp Order     Status: None   Collection Time: 09/26/2018  1:17 PM  Result Value Ref Range Status   SARS Coronavirus 2 NEGATIVE NEGATIVE Final    Comment: (NOTE) If result is NEGATIVE SARS-CoV-2 target nucleic acids are NOT DETECTED. The SARS-CoV-2 RNA is generally detectable in upper and lower  respiratory specimens during the acute phase of infection. The  lowest  concentration of SARS-CoV-2 viral copies this assay can detect is 250  copies / mL. A negative result does not preclude SARS-CoV-2 infection  and should not be used as the sole basis for treatment or other  patient management decisions.  A negative result may occur with  improper specimen collection / handling, submission of specimen other  than nasopharyngeal swab, presence of viral mutation(s) within the  areas targeted by this assay, and inadequate number of viral copies  (<250 copies / mL). A negative result must be combined with clinical  observations, patient history, and epidemiological information. If result is POSITIVE SARS-CoV-2 target nucleic acids  are DETECTED. The SARS-CoV-2 RNA is generally detectable in upper and lower  respiratory specimens dur ing the acute phase of infection.  Positive  results are indicative of active infection with SARS-CoV-2.  Clinical  correlation with patient history and other diagnostic information is  necessary to determine patient infection status.  Positive results do  not rule out bacterial infection or co-infection with other viruses. If result is PRESUMPTIVE POSTIVE SARS-CoV-2 nucleic acids MAY BE PRESENT.   A presumptive positive result was obtained on the submitted specimen  and confirmed on repeat testing.  While 2019 novel coronavirus  (SARS-CoV-2) nucleic acids may be present in the submitted sample  additional confirmatory testing may be necessary for epidemiological  and / or clinical management purposes  to differentiate between  SARS-CoV-2 and other Sarbecovirus currently known to infect humans.  If clinically indicated additional testing with an alternate test  methodology [phone]) is advised. The SARS-CoV-2 RNA is generally  detectable in upper and lower respiratory sp ecimens during the acute  phase of infection. The expected result is Negative. Fact Sheet for Patients:  StrictlyIdeas.no Fact Sheet for Healthcare Providers: BankingDealers.co.za This test is not yet approved or cleared by the [place] FDA and has been authorized for detection and/or diagnosis of SARS-CoV-2 by FDA under an Emergency Use Authorization (EUA).  This EUA will remain in effect (meaning this test can be used) for the duration of the COVID-19 declaration under Section 564(b)(1) of the Act, 21 U.S.C. section 360bbb-3(b)(1), unless the authorization is terminated or revoked sooner. Performed at [facility], [place] [address] [place], [place] [address]   MRSA PCR Screening     Status: None   Collection Time: 09/08/2018 10:31 PM  Result Value Ref Range  Status   MRSA by PCR NEGATIVE NEGATIVE Final    Comment:        The GeneXpert MRSA Assay (FDA approved for NASAL specimens only), is one component of a comprehensive MRSA colonization surveillance program. It is not intended to diagnose MRSA infection nor to guide or monitor treatment for MRSA infections. Performed at [facility], [place] [address], [place], [place] [address]   Culture, Urine     Status: None   Collection Time: 09/20/18 10:09 PM  Result Value Ref Range Status   Specimen Description URINE, RANDOM  Final   Special Requests NONE  Final   Culture   Final    NO GROWTH Performed at [facility], [place] [address] [place], [place] [address]    Report Status 09/21/2018 FINAL  Final      Studies: No results found.  Scheduled Meds: . collagenase   Topical Daily  . feeding supplement (ENSURE ENLIVE)  237 mL Oral TID BM  . folic acid  1 mg Oral Daily  . furosemide  40 mg Intravenous BID  .  ipratropium-albuterol  3 mL Nebulization TID  . methylPREDNISolone (SOLU-MEDROL) injection  40 mg Intravenous Q12H  . mometasone-formoterol  1 puff Inhalation BID  . multivitamin with minerals  1 tablet Oral Daily  . sodium chloride flush  10-40 mL Intracatheter Q12H  . sodium chloride flush  3 mL Intravenous Q12H  . thiamine  100 mg Oral Daily    Continuous Infusions: . sodium chloride 75 mL/hr at 09/23/18 0758  . azithromycin 500 mg (09/22/18 1718)  . cefTRIAXone (ROCEPHIN)  IV 1 g (09/22/18 1718)     LOS: 6 days     Kayleen Memos, MD [facility] Pager [phone]  If 7PM-7AM, please contact night-coverage [email or web address] Password [facility] 09/23/2018, 3:03 PM

## 2018-09-23 NOTE — Consult Note (Signed)
Consultation Note Date: 09/23/2018   Patient Name: Anthony Dorsey  DOB: 19-Feb-1951  MRN: [id number]  Age / Sex: 68 y.o., male   PCP: Patient, No Pcp Per Referring Physician: Kayleen Memos, DO   REASON FOR CONSULTATION:Establishing goals of care  Palliative Care consult requested for this 68 y.o. male with multiple medical problems including tobacco and alcohol use. He presented with complaints of worsening dyspnea and fatigue. He was noted to be hypoxic in the 80s and chest x-ray was worrisome for infiltrate and possible mass. He was unable to tolerate CT by laying flat. Since admission continues to have audible wheezing and shortness of breath. He was started on rocephin and azithromycin, along with scheduled duonebs. COVID test negative. Several episodes of hemoptysis however he has declined CT of chest.   Clinical Assessment and Goals of Care:  I have reviewed medical records including lab results, imaging, Epic notes, and MAR, received report from the bedside RN, and assessed the patient. I met at the bedside with patient to discuss diagnosis prognosis, GOC, EOL wishes, disposition and options. Anthony Dorsey is sitting up in chair. He is alert and oriented x3. Complains of shortness of breath. Audible of wheezing without stethoscope.   I offered to contact his daughter, Anthony Dorsey to include her in our [place] discussion, however patient refused and expressed "I  could tell her what needs to be told".  I introduced Palliative Medicine as specialized medical care for people living with serious illness. It focuses on providing relief from the symptoms and stress of a serious illness. The goal is to improve quality of life for both the patient and the family.  We discussed a brief life review, along with functional and nutritional status. Patient reports he lives alone, and has one daughter who is his main support. He has 2 grandchildren. He reports he has worked many jobs over the years in [employer]. He loves sports and spending time with family. He is of [place] faith.   Prior to admission he reports he was able to perform all ADLs independently. He states his daughter ran all errands and assisted with his meals. He denies home oxygen use. Endorses cigarette smoking and occasional alcohol use (beer) states he drinks about 2-3 beers every few weeks and sometimes none for months. He reports a decrease in his appetite and known weight loss. He reports he thinks he has lost about 50lbs in the past 2-3 months.   We discussed His current illness and what it means in the larger context of His on-going co-morbidities. With specific discussions regarding his acute respiratory failure, tobacco use, COPD, and possible lung mass versus pneumonia.  Natural disease trajectory and expectations at EOL were discussed. Patient verbalized understanding of his condition.   Patient direct in his conversation expressing, "noone is God but most medical doctors act like they are!" Explained that as a medical team our goal is to help all of our patient's to the best of our ability based on the patient's goals and expectations of their care. He verbalized understanding.   Anthony Dorsey expressed he has not seen a provider in years and would not be here today, if he wasn't feeling so bad and having a hard time breathing. He reports "they want me to take this CT but I can't breath and it makes me nervous laying down." We discussed the necessity of the CT scan and he agrees to complete if he can have medication to  help him relax. He is aware I will discuss further with his attending. He also expresses he would not want to have it done until tomorrow and not first thing in the morning. I explained this could not be guaranteed however staff would update him once it is planned. He verbalized understanding.   I discussed with patient why he had not visited a doctor in years. He expressed "no one is going to  practice or treat me as their [place] pig. I haven't been sick and if I was oh well!" explained to patient the importance of regular follow ups and early prevention. He also expressed he does not trust the system and would not allow any male doctors who make him feel like he is lesser. He request all providers be male in the future. I advised that this may not be possible but would discuss with the medical team as well.   I educated patient further regarding his pneumonia and potential lung mass. He expressed he did not care if he had cancer. When asked if the CT showed a lung mass would he want further work-up or treatment. Patient expressed he would not take any forms of chemotherapy or radiation therapy. He stated "Iwill take oxygen and some pills but not chemo." I explained his poor prognosis in the event he does have cancer with no treatment. Patient expressed "if I have cancer send me home with my family and I will die there happy. Don't even waste your breath asking me to go to the doctor or taking treatment, it is out of the question!"   I attempted to elicit values and goals of care important to the patient.    The difference between aggressive medical intervention and comfort care was considered in light of the patient's goals of care. Patient request to continue to treat. He does not want any aggressive work-up if CT scan shows a lung mass/cancer.   Patient denies that he has a documented advanced directive and declines information. When asked about medical decisions he expressed in the event he was unable to make medical decisions his daughter Anthony Dorsey is his desired decision maker. He confirms wishes for DNR/DNI and that he would not want any forms of artificial feedings or HD.   Hospice and Palliative Care services outpatient were explained and offered. Patient verbalized their understanding and awareness of both palliative and hospice's goals and philosophy of care. He request  outpatient palliative for support, however, he expressed if he does have cancer he would be open to hospice support at home.   Questions and concerns were addressed.  Hard Choices booklet left for review. Patient was encouraged to call with questions or concerns.  PMT will continue to support holistically.   SOCIAL HISTORY:     reports that he has been smoking cigarettes. He has a 40.00 pack-year smoking history. He has never used smokeless tobacco. He reports current alcohol use.  CODE STATUS: DNR/DNI-as confirmed by patient.   ADVANCE DIRECTIVES: Primary Decision Maker: Patient or daughter: Anthony Dorsey at patient's request.     SYMPTOM MANAGEMENT: Per attending   Palliative Prophylaxis:   Aspiration, Frequent Pain Assessment and Oral Care  PSYCHO-SOCIAL/SPIRITUAL:  Support System: Family  Desire for further Chaplaincy support:NO   Additional Recommendations (Limitations, Scope, Preferences):  Full Scope Treatment, No Artificial Feeding, No Chemotherapy and No Hemodialysis   PAST MEDICAL HISTORY:History reviewed. No pertinent past medical history.  PAST SURGICAL HISTORY: History reviewed. No pertinent surgical history.  ALLERGIES:  is allergic to codeine.   MEDICATIONS:  Current Facility-Administered Medications  Medication Dose Route Frequency Provider Last Rate Last Dose  . 0.9 %  sodium chloride infusion   Intravenous Continuous Toy Baker, MD 75 mL/hr at 09/23/18 0758    . acetaminophen (TYLENOL) tablet 650 mg  650 mg Oral Q6H PRN Toy Baker, MD       Or  . acetaminophen (TYLENOL) suppository 650 mg  650 mg Rectal Q6H PRN Doutova, Anastassia, MD      . azithromycin (ZITHROMAX) 500 mg in sodium chloride 0.9 % 250 mL IVPB  500 mg Intravenous Q24H Donne Hazel, MD 250 mL/hr at 09/22/18 1718 500 mg at 09/22/18 1718  . cefTRIAXone (ROCEPHIN) 1 g in sodium chloride 0.9 % 100 mL IVPB  1 g Intravenous Q24H Donne Hazel, MD 200 mL/hr at 09/22/18  1718 1 g at 09/22/18 1718  . collagenase (SANTYL) ointment   Topical Daily Donne Hazel, MD   1 application at 16/10/96 [phone]  . feeding supplement (ENSURE ENLIVE) (ENSURE ENLIVE) liquid 237 mL  237 mL Oral TID BM Donne Hazel, MD   237 mL at 09/23/18 0935  . folic acid (FOLVITE) tablet 1 mg  1 mg Oral Daily Doutova, Anastassia, MD   1 mg at 09/23/18 0934  . furosemide (LASIX) injection 40 mg  40 mg Intravenous BID Donne Hazel, MD   40 mg at 09/23/18 0759  . ipratropium-albuterol (DUONEB) 0.5-2.5 (3) MG/3ML nebulizer solution 3 mL  3 mL Nebulization TID Donne Hazel, MD   3 mL at 09/23/18 0801  . levalbuterol (XOPENEX) nebulizer solution 0.63 mg  0.63 mg Nebulization Q6H PRN Toy Baker, MD   0.63 mg at 09/23/18 0421  . lidocaine-prilocaine (EMLA) cream   Topical Daily PRN Donne Hazel, MD      . methylPREDNISolone sodium succinate (SOLU-MEDROL) 40 mg/mL injection 40 mg  40 mg Intravenous Q12H Donne Hazel, MD   40 mg at 09/23/18 0351  . mometasone-formoterol (DULERA) 200-5 MCG/ACT inhaler 1 puff  1 puff Inhalation BID Doutova, Anastassia, MD   1 puff at 09/23/18 0900  . multivitamin with minerals tablet 1 tablet  1 tablet Oral Daily Donne Hazel, MD   1 tablet at 09/23/18 [phone]  . sodium chloride flush (NS) 0.9 % injection 10-40 mL  10-40 mL Intracatheter Q12H Donne Hazel, MD   10 mL at 09/23/18 0935  . sodium chloride flush (NS) 0.9 % injection 10-40 mL  10-40 mL Intracatheter PRN Donne Hazel, MD      . sodium chloride flush (NS) 0.9 % injection 3 mL  3 mL Intravenous Q12H Toy Baker, MD   3 mL at 09/22/18 2222  . thiamine (VITAMIN B-1) tablet 100 mg  100 mg Oral Daily Doutova, Anastassia, MD   100 mg at 09/23/18 0935    VITAL SIGNS: BP 106/82 (BP Location: Right Arm)   Pulse (!) 106   Temp (!) 97.4 F (36.3 C) (Oral)   Resp 18   Ht _0  (1.753 m)   Wt 47.8 kg   SpO2 94%   BMI 15.56 kg/m  Filed Weights   09/20/2018 1245 09/18/18 0453   Weight: 63.5 kg 47.8 kg    Estimated body mass index is 15.56 kg/m as calculated from the following:   Height as of this encounter: _1  (1.753 m).   Weight as of this encounter: 47.8 kg.  LABS: CBC:    Component Value  Date/Time   WBC 12.8 (H) 09/22/2018 0227   HGB 10.8 (L) 09/22/2018 0227   HCT 34.5 (L) 09/22/2018 0227   PLT 488 (H) 09/22/2018 0227   Comprehensive Metabolic Panel:    Component Value Date/Time   NA 141 09/22/2018 0227   K 3.9 09/22/2018 0227   CO2 33 (H) 09/22/2018 0227   BUN 21 09/22/2018 0227   CREATININE 0.59 (L) 09/22/2018 0227     Review of Systems  Respiratory: Positive for cough, shortness of breath and wheezing.   Neurological: Positive for weakness.   Unless otherwise noted, a complete review of systems is negative.  Physical Exam General: NAD, chronically-ill frail appearing, cachectic  Cardiovascular: tachycardic Pulmonary: audible wheeze, rhonchi Abdomen: soft, nontender, + bowel sounds Extremities: bilateral lower edema, no joint deformities Skin: no rashes, dry Neurological: Weakness, irritable, direct conversation   Prognosis: Guarded to Poor in the setting of acute hypoxic respiratory failure, tobacco use, hypercalcemia, lung mass vs. Pneumonia, COPD, and poor health maintenance.   Discharge Planning:  home at patient's request with outpatient palliative and home health  Recommendations:  DNR/DNI-as confirmed by patient  Continue to treat (limitations set by patient)  Patient agreeing to have CT with sedation medication, understands risk of respiratory depression.   Goals set he does not wish to have aggressive work-up in regards to possible cancer, expresses he would not want chemotherapy, radiation. Does not want any forms of heroic measures, artificial feedings, or HD.   Agrees with outpatient palliative support (referral placed)  PMT will continue to support and follow as needed. Please call.    Palliative  Performance Scale: PPS 40%               Patient expressed understanding and was in agreement with this plan.   Thank you for allowing the Palliative Medicine Team to assist in the care of this patient.  Time In: 1015 Time Out: 1130 Time Total: 75 min.   Visit consisted of counseling and education dealing with the complex and emotionally intense issues of symptom management and palliative care in the setting of serious and potentially life-threatening illness.Greater than 50%  of this time was spent counseling and coordinating care related to the above assessment and plan.  Signed by:  Alda Lea, AGPCNP-BC Palliative Medicine Team  Phone: [phone] Fax: [phone] Pager: [phone] Amion: Bjorn Pippin

## 2018-09-23 NOTE — Progress Notes (Signed)
OT Cancellation Note  Patient Details Name: MACKLEN WILHOITE MRN: [id number] DOB: 1951-01-16   Cancelled Treatment:    Reason Eval/Treat Not Completed: Other (comment);Patient declined, evaluation performed 5/23. There have been no acute events and no change in status since this evaluation when the patient declined further therapy services. OT went and talked to Mr. Olden again to see if something had changed. The purpose of therapy was explained and again Mr. Heard declined. OT will sign off again.   Merri Ray Starlyn Droge 09/23/2018, 3:55 PM   Hulda Humphrey OTR/L Acute Rehabilitation Services Pager: [phone] Office: [phone]

## 2018-09-24 ENCOUNTER — Inpatient Hospital Stay (HOSPITAL_COMMUNITY): Payer: Medicare Other

## 2018-09-24 DIAGNOSIS — L8990 Pressure ulcer of unspecified site, unspecified stage: Secondary | ICD-10-CM

## 2018-09-24 LAB — PHOSPHORUS: Phosphorus: 4 mg/dL (ref 2.5–4.6)

## 2018-09-24 LAB — CBC WITH DIFFERENTIAL/PLATELET
Abs Immature Granulocytes: 0.23 10*3/uL — ABNORMAL HIGH (ref 0.00–0.07)
Basophils Absolute: 0 10*3/uL (ref 0.0–0.1)
Basophils Relative: 0 %
Eosinophils Absolute: 0 10*3/uL (ref 0.0–0.5)
Eosinophils Relative: 0 %
HCT: 34 % — ABNORMAL LOW (ref 39.0–52.0)
Hemoglobin: 11 g/dL — ABNORMAL LOW (ref 13.0–17.0)
Immature Granulocytes: 1 %
Lymphocytes Relative: 5 %
Lymphs Abs: 0.9 10*3/uL (ref 0.7–4.0)
MCH: 30.1 pg (ref 26.0–34.0)
MCHC: 32.4 g/dL (ref 30.0–36.0)
MCV: 92.9 fL (ref 80.0–100.0)
Monocytes Absolute: 0.7 10*3/uL (ref 0.1–1.0)
Monocytes Relative: 4 %
Neutro Abs: 16.8 10*3/uL — ABNORMAL HIGH (ref 1.7–7.7)
Neutrophils Relative %: 90 %
Platelets: 486 10*3/uL — ABNORMAL HIGH (ref 150–400)
RBC: 3.66 MIL/uL — ABNORMAL LOW (ref 4.22–5.81)
RDW: 16.3 % — ABNORMAL HIGH (ref 11.5–15.5)
WBC: 18.7 10*3/uL — ABNORMAL HIGH (ref 4.0–10.5)
nRBC: 0 % (ref 0.0–0.2)

## 2018-09-24 LAB — COMPREHENSIVE METABOLIC PANEL
ALT: 72 U/L — ABNORMAL HIGH (ref 0–44)
AST: 40 U/L (ref 15–41)
Albumin: 3 g/dL — ABNORMAL LOW (ref 3.5–5.0)
Alkaline Phosphatase: 89 U/L (ref 38–126)
Anion gap: 9 (ref 5–15)
BUN: 21 mg/dL (ref 8–23)
CO2: 36 mmol/L — ABNORMAL HIGH (ref 22–32)
Calcium: 11.4 mg/dL — ABNORMAL HIGH (ref 8.9–10.3)
Chloride: 95 mmol/L — ABNORMAL LOW (ref 98–111)
Creatinine, Ser: 0.54 mg/dL — ABNORMAL LOW (ref 0.61–1.24)
GFR calc Af Amer: >60 mL/min (ref >60)
GFR calc non Af Amer: >60 mL/min (ref >60)
Glucose, Bld: 111 mg/dL — ABNORMAL HIGH (ref 70–99)
Potassium: 4.2 mmol/L (ref 3.5–5.1)
Sodium: 140 mmol/L (ref 135–145)
Total Bilirubin: 0.1 mg/dL — ABNORMAL LOW (ref 0.3–1.2)
Total Protein: 6.6 g/dL (ref 6.5–8.1)

## 2018-09-24 LAB — LACTIC ACID, PLASMA: Lactic Acid, Venous: 1.4 mmol/L (ref 0.5–1.9)

## 2018-09-24 LAB — PROCALCITONIN: Procalcitonin: <0.1 ng/mL

## 2018-09-24 LAB — MAGNESIUM: Magnesium: 2.1 mg/dL (ref 1.7–2.4)

## 2018-09-24 MED ORDER — ENOXAPARIN SODIUM 40 MG/0.4ML ~~LOC~~ SOLN
40.0000 mg | SUBCUTANEOUS | Status: DC
  Administered 2018-09-25: 16:00:00 40 mg via SUBCUTANEOUS
  Filled 2018-09-24: qty 0.4

## 2018-09-24 MED ORDER — LACTATED RINGERS IV SOLN
INTRAVENOUS | Status: DC
  Administered 2018-09-24: 11:00:00 via INTRAVENOUS

## 2018-09-24 MED ORDER — GUAIFENESIN-DM 100-10 MG/5ML PO SYRP: 5.0000 mL | ORAL_SOLUTION | ORAL | Status: DC | PRN

## 2018-09-24 NOTE — Progress Notes (Addendum)
1630 Patient complaining of not being able to breath. Respiratory called and patient given neb treatment and noted that patient had stridor. Audible inspiratory and expiratory wheezing.  1750 Patients o2sats decreased to 80's and patient using accessory muscles and visibly distressed. Discussed with Dr. Nevada Crane and orders given for stat ABG and bi-pap. Notified RT and patient on bi-pap. Resting quietly at [place].

## 2018-09-24 NOTE — Progress Notes (Signed)
PT Cancellation Note  Patient Details Name: NAJIR ROOP MRN: [id number] DOB: 01/03/1951   Cancelled Treatment:    Reason Eval/Treat Not Completed: Medical issues which prohibited therapy.  Pt's breathing is harsh and he is coughing up blood.  Will see if able 5/30. 09/24/2018  Donnella Sham, PT Acute Rehabilitation Services [phone]  (pager) [phone]  (office)   Tessie Fass Duanne Duchesne 09/24/2018, 12:18 PM

## 2018-09-24 NOTE — Consult Note (Signed)
NAME:  Anthony Dorsey, MRN:  [id number], DOB:  01-24-1951, LOS: 7 ADMISSION DATE:  09/26/2018, CONSULTATION DATE:  09/10/2018 REFERRING MD:  , CHIEF COMPLAINT:  Hemoptysis, Shortness of breath  Brief History   Anthony Dorsey is a 68 year old gentleman with history of tobacco abuse who presents with progressive shortness of breath and hemoptysis in setting of recent weight loss ( 50 pounds over the last 2-3 months).  History of present illness   Anthony Dorsey is a 68 year old gentleman with history of heavy tobacco abuse who we are consulted to evaluate for hemoptysis and ongoing wheezing and stridor.  Patient states that about 3 to 4 weeks ago he developed hemoptysis.  He states that he still coughing and coughing up a lot of blood at that time.  He states that over the next 3 to 4 weeks his hemoptysis is actually gotten better.  He states that now he is coughing blood mixed with sputum about once a day.  Does about a cup at bedside with clear sputum mixed with some blood.  He denies taking any anticoagulation.  He denies ever having symptoms like this before.  He denies bleeding from anywhere else.  He has no personal history of family history of lung cancer.  He denies chest pain, fever or chills.  He states that over the past 3 to 4 weeks he has developed progressive shortness of breath.  He states that he shortness of breath is worse with exertion not present at rest.  He denies orthopnea PND.  Denies lower extremity swelling.  He states that he presented to the hospital today due to progressive shortness of breath.  He has significant smoking history.  He is a current smoker. He  smokes a pack a day and has been smoking for the past 50 years. He endorses 25lb unintentional weight loss over the past 6 weeks. He denies night sweats.  Past Medical History  -Tobacco abuse >> ongoing  [facility] admission 5/22  Consults:  -PCCM  Procedures:  -None   Significant  Diagnostic Tests:  09/24/2018>>CXR-Persistent soft tissue prominence in the right paratracheal region. This appearance is concerning for adenopathy. Contrast enhanced chest CT advised to further evaluate.  Elsewhere, no edema or consolidation is evident. There is slight right base atelectasis. Heart size is normal.  Micro Data:  -Urine Culture 5/25>> No growth  Antimicrobials:  -Amoxicillin and azithromycin given in ED -Ceftriaxone and doxycycline ordered >> completed  Objective   Blood pressure (!) 142/77, pulse (!) 112, temperature 98.1 F (36.7 C), temperature source Oral, resp. rate 17, height 5\' 9"  (1.753 m), weight 46.1 kg, SpO2 99 %.        Intake/Output Summary (Last 24 hours) at 09/24/2018 1219 Last data filed at 09/24/2018 0755 Gross per 24 hour  Intake 2319.99 ml  Output -  Net 2319.99 ml   Filed Weights   09/15/2018 1245 09/18/18 0453 09/24/18 0120  Weight: 63.5 kg 47.8 kg 46.1 kg    Examination: General: Cachectic, on oxygen, anxious HENT: Adentulous, no masses or adenopathy Lungs: Bilateral scattered inspiratory and expiratory wheezes, stridor and upper airway  pseudo-wheezing  Cardiovascular: S1, S2, RRR, NO MRG Abdomen: Soft, no organomegaly, BS +, Cacectic Extremities: Dry skin, no deformities +1 BLE edema Neuro: No focal neurological deficits. MAE x 4   Assessment & Plan:  #Hemoptysis #Progressive Dyspnea>> unable to lay flat #Tobacco abuse #Weight loss Patient's constellation of symptoms in the setting of tobacco abuse is most  concerning for malignancy. He has no infectious symptoms. His hemoptysis is not massive and improving per the patient. CXR concerning for possible RUL mass. He is hemodynamically stable - pt has refused CT imaging up to this point.He does not feel he can lay flat to be scanned. Palliative care was consulted and patient has made himself a DNR. He does not want an aggressive work up in regards to possible cancer. He expresses that he  would not want chemotherapy, radiation or any heroic measures, artificial feedings or HD. He has agreed to OP Palliative support. Plan -If massive hemoptysis place RIGHT lung down - IR consult if he develops massive hemoptysis per patient wishes, as he may prefer not to treat - Trend CBC, transfuse for HGB < 7 - Hold all anticoagulation - Cough supression - We will optimize patients pulmonary status  - Continue Duonebs  TID - Continue Xopenex nebs  Q 6 prn - Continue Dulera - Continue Racemic Epi - Continue Solumedrol  After speaking at length with patient he has decided he would like to go home with hospice.He does not want any further work up. Palliative to make arrangements. PCCM will sign off      Labs   CBC: Recent Labs  Lab 09/09/2018 1305 09/18/18 0801 09/20/18 1410 09/22/18 0227 09/24/18 0419  WBC 14.2* 12.8* 9.0 12.8* 18.7*  NEUTROABS 9.4* 7.8*  --   --  16.8*  HGB 12.5* 12.2* 10.9* 10.8* 11.0*  HCT 39.3 37.2* 34.4* 34.5* 34.0*  MCV 92.5 92.3 93.2 93.0 92.9  PLT 528* PLATELETS APPEAR ADEQUATE 477* 488* 486*    Basic Metabolic Panel: Recent Labs  Lab 09/20/2018 1305 09/20/18 1410 09/22/18 0227 09/24/18 0419  NA 138 135 141 140  K 3.6 3.9 3.9 4.2  CL 99 96* 96* 95*  CO2 25 33* 33* 36*  GLUCOSE 114* 124* 138* 111*  BUN 6* 11 21 21   CREATININE 0.58* 0.50* 0.59* 0.54*  CALCIUM 10.8* 10.3 10.7* 11.4*  MG  --   --   --  2.1  PHOS  --   --   --  4.0   GFR: Estimated Creatinine Clearance: 60 mL/min (A) (by C-G formula based on SCr of 0.54 mg/dL (L)). Recent Labs  Lab 09/18/18 0801 09/20/18 1410 09/22/18 0227 09/24/18 0419 09/24/18 0504 09/24/18 0505  PROCALCITON  --   --   --   --  <0.10  --   WBC 12.8* 9.0 12.8* 18.7*  --   --   LATICACIDVEN  --   --   --   --   --  1.4    Cardiac Enzymes: Recent Labs  Lab 09/19/2018 1305  TROPONINI <0.03    HbA1C: No results found for: HGBA1C  CBG: Recent Labs  Lab 09/23/18 1111  GLUCAP 111*    Review  of Systems:     Past Medical History  He,  has no past medical history on file.   Surgical History   History reviewed. No pertinent surgical history.   Social History   reports that he has been smoking cigarettes. He has a 40.00 pack-year smoking history. He has never used smokeless tobacco. He reports current alcohol use.   Family History   His family history includes Diabetes in his father and mother.   Allergies Allergies  Allergen Reactions  . Codeine Itching     Home Medications  Prior to Admission medications   Medication Sig Start Date End Date Taking? Authorizing Provider  Budesonide (PULMICORT IN)  Inhale 1 vial into the lungs 2 (two) times a day.   Yes [provider]     Consult time: [place], AGACNP-BC [place] Pager # [phone] Pager [phone] after 4 pm 09/24/2018 12:44 PM

## 2018-09-24 NOTE — Progress Notes (Addendum)
PROGRESS NOTE  Anthony Dorsey PXT:[id number] DOB: 10-11-1950 DOA: 08/31/2018 PCP: Patient, No Pcp Per  HPI/Recap of past 24 hours: 68 y.o.malewith medical history significant of tobacco abuse Presented with 1 month of worsening dyspnea, he got tired of feeling like that and came in to ER. Reports wheezing ongoing tobacco abuse 1.5 pack/day. Symptom onset gradual nothing seems to make it better reports productive cough and occasional chest pain while coughing but no chest pain at baseline. No fevers or chills.  Chest x-ray done on 09/20/2018 with concern for right paratracheal mass or adenopathy.  Chest CT recommended but patient declines multiple times. Worsening wheezes now on racemic epi as needed.  09/24/18: Patient seen and examined at his bedside.  He is sitting in a chair and has audible wheezes.  He is on 5 L oxygen to maintain O2 sat greater than 90% and has conversational dyspnea.  Patient continues to decline CT chest and anxiolytics/sedation to assist with his comfort.  Pulmonology consulted.  Dr. Vaughan Browner will see in consultation.  Assessment/Plan: Active Problems:   COPD with acute exacerbation (HCC)   Tobacco abuse   Acute respiratory failure with hypoxia (HCC)   Hypercalcemia   Lung mass   Prolonged QT interval   COPD exacerbation (HCC)   Alcohol abuse   Hemoptysis   Pressure injury of skin  . COPD with acute exacerbation and acute hypoxemic respiratory failure (HCC) -  -Persistent audible wheezing on exam with conversational dyspnea -Continue racemic epi every 4 hours PRN for wheezing -Completed 5 days of Rocephin and azithro - Continue with XopenexPRN with scheduled duoneb - Breo or Dulera at discharge - Continues to decline CT chest with contrast.  Also declines sedation/anxiolytic to assist with discomfort.  Acute hypoxic respiratory failure likely multifactorial secondary to COPD exacerbation versus possible lung mass Currently requiring 5 L  oxygen by nasal cannula to maintain O2 saturation greater than 90% COVID-19 negative Management stated above Palliative care team following  Right paratracheal region soft tissue prominence with concern for adenopathy Patient declines CT chest to further assess these findings stating he does not want to lay down flat but refuses sedation or anxiolytics  Medical noncompliance Patient is alert and oriented x3 Noncompliant with medical management  . Tobacco abuse - -Tobacco cessation counseling at bedside -Continue with nicotine patch as tolerated  Worsening hypercalcemia -Calcium 11.4 from 10.7  -Start IV fluid  -Repeat labs in the morning   Community-acquired pneumonia  -Patient had refused CT chest thus far Intermittent hemoptysis reported Completed 5 days of Rocephin and azithromycin Procalcitonin and lactic acid done on 09/24/2018- IV antibiotics DC'd.  Resolved prolonged QT interval  -will monitor on tele avoid QT prolonging medications, rehydrate correct electrolytes as needed -Most recent QTc of 411 on recent EKG  Alcohol abuse with concern for withdrawal - Continue CIWA protocol - Without evidence of withdrawal at this time  BLE edema -Patient is continued on empiric lasix given recent hypoxemic failure -BLE edema noted on exam -Denies tenderness on palpation -2d echo done on 09/21/2018 shows normal LVEF  Ambulatory dysfunction/physical debility Has declined occupational therapy while inpatient  Severe protein calorie malnutrition BMI 15 Encourage increase protein oral calorie intake Oral supplement Obtain CMP mag and phosphate  Unstageable pressure injury to left trochanter. Present on admission.  Wound type:unstageable pressure injury  Pressure Injury POA: Yes  Measurement: 3 cm x 3 cm unable to view wound bed due to the presence of eschar  Wound bed:25% pale pink and  75% soft gray slough  Drainage (amount, consistency, odor) minimal  serosanguinous No odor  Periwound:blanchable erythema  Dressing procedure/placement/frequency:Cleanse left hip wound with NS and pat dry. Apply Santyl to wound bed. Cover with NS moist 2x2. Cover with foam dressing. Change daily.    DVT prophylaxis: SCD's Code Status: DNR Family Communication: Pt in room, family not at bedside Disposition Plan:  DC home with palliative care services when pulmonology signs off.  Consultants:   Palliative care team  Pulmonology  Procedures:    None   Objective: Vitals:   09/24/18 0400 09/24/18 0736 09/24/18 0740 09/24/18 0754  BP: 112/68   133/83  Pulse: (!) 101   (!) 104  Resp: 17   16  Temp: (!) 97.5 F (36.4 C)   (!) 97.4 F (36.3 C)  TempSrc: Oral   Oral  SpO2: 96% 98% 98% 98%  Weight:      Height:        Intake/Output Summary (Last 24 hours) at 09/24/2018 0932 Last data filed at 09/24/2018 0755 Gross per 24 hour  Intake 2319.99 ml  Output -  Net 2319.99 ml   Filed Weights   09/02/2018 1245 09/18/18 0453 09/24/18 0120  Weight: 63.5 kg 47.8 kg 46.1 kg    Exam:  . General: 68 y.o. year-old male severely emaciated.  Appears uncomfortable due to conversational dyspnea and audible wheezing.  . Cardiovascular: Regular rate and rhythm with no rubs or gallops.  No JVD or thyromegaly noted. Marland Kitchen Respiratory: Audible wheezing with poor inspiratory effort. . Abdomen: Soft nontender nondistended normal bowel sounds x4 quadrants. . Musculoskeletal: Bilateral lower extremity edema. . Skin: Very dry skin affecting lower extremities. Marland Kitchen Psychiatry: Mood is irritable.   Data Reviewed: CBC: Recent Labs  Lab 09/26/2018 1305 09/18/18 0801 09/20/18 1410 09/22/18 0227 09/24/18 0419  WBC 14.2* 12.8* 9.0 12.8* 18.7*  NEUTROABS 9.4* 7.8*  --   --  16.8*  HGB 12.5* 12.2* 10.9* 10.8* 11.0*  HCT 39.3 37.2* 34.4* 34.5* 34.0*  MCV 92.5 92.3 93.2 93.0 92.9  PLT 528* PLATELETS APPEAR ADEQUATE 477* 488* 355*   Basic Metabolic Panel:  Recent Labs  Lab 09/13/2018 1305 09/20/18 1410 09/22/18 0227 09/24/18 0419  NA 138 135 141 140  K 3.6 3.9 3.9 4.2  CL 99 96* 96* 95*  CO2 25 33* 33* 36*  GLUCOSE 114* 124* 138* 111*  BUN 6* 11 21 21   CREATININE 0.58* 0.50* 0.59* 0.54*  CALCIUM 10.8* 10.3 10.7* 11.4*  MG  --   --   --  2.1  PHOS  --   --   --  4.0   GFR: Estimated Creatinine Clearance: 60 mL/min (A) (by C-G formula based on SCr of 0.54 mg/dL (L)). Liver Function Tests: Recent Labs  Lab 09/24/18 0419  AST 40  ALT 72*  ALKPHOS 89  BILITOT 0.1*  PROT 6.6  ALBUMIN 3.0*   No results for input(s): LIPASE, AMYLASE in the last 168 hours. No results for input(s): AMMONIA in the last 168 hours. Coagulation Profile: No results for input(s): INR, PROTIME in the last 168 hours. Cardiac Enzymes: Recent Labs  Lab 09/25/2018 1305  TROPONINI <0.03   BNP (last 3 results) No results for input(s): PROBNP in the last 8760 hours. HbA1C: No results for input(s): HGBA1C in the last 72 hours. CBG: Recent Labs  Lab 09/23/18 1111  GLUCAP 111*   Lipid Profile: No results for input(s): CHOL, HDL, LDLCALC, TRIG, CHOLHDL, LDLDIRECT in the last 72 hours. Thyroid Function  Tests: No results for input(s): TSH, T4TOTAL, FREET4, T3FREE, THYROIDAB in the last 72 hours. Anemia Panel: No results for input(s): VITAMINB12, FOLATE, FERRITIN, TIBC, IRON, RETICCTPCT in the last 72 hours. Urine analysis: No results found for: COLORURINE, APPEARANCEUR, LABSPEC, PHURINE, GLUCOSEU, HGBUR, BILIRUBINUR, KETONESUR, PROTEINUR, UROBILINOGEN, NITRITE, LEUKOCYTESUR Sepsis Labs: @LABRCNTIP (procalcitonin:4,lacticidven:4)  ) Recent Results (from the past 240 hour(s))  SARS Coronavirus 2 (CEPHEID- Performed in [place] hospital lab), Hosp Order     Status: None   Collection Time: 09/05/2018  1:17 PM  Result Value Ref Range Status   SARS Coronavirus 2 NEGATIVE NEGATIVE Final    Comment: (NOTE) If result is NEGATIVE SARS-CoV-2 target nucleic  acids are NOT DETECTED. The SARS-CoV-2 RNA is generally detectable in upper and lower  respiratory specimens during the acute phase of infection. The lowest  concentration of SARS-CoV-2 viral copies this assay can detect is 250  copies / mL. A negative result does not preclude SARS-CoV-2 infection  and should not be used as the sole basis for treatment or other  patient management decisions.  A negative result may occur with  improper specimen collection / handling, submission of specimen other  than nasopharyngeal swab, presence of viral mutation(s) within the  areas targeted by this assay, and inadequate number of viral copies  (<250 copies / mL). A negative result must be combined with clinical  observations, patient history, and epidemiological information. If result is POSITIVE SARS-CoV-2 target nucleic acids are DETECTED. The SARS-CoV-2 RNA is generally detectable in upper and lower  respiratory specimens dur ing the acute phase of infection.  Positive  results are indicative of active infection with SARS-CoV-2.  Clinical  correlation with patient history and other diagnostic information is  necessary to determine patient infection status.  Positive results do  not rule out bacterial infection or co-infection with other viruses. If result is PRESUMPTIVE POSTIVE SARS-CoV-2 nucleic acids MAY BE PRESENT.   A presumptive positive result was obtained on the submitted specimen  and confirmed on repeat testing.  While 2019 novel coronavirus  (SARS-CoV-2) nucleic acids may be present in the submitted sample  additional confirmatory testing may be necessary for epidemiological  and / or clinical management purposes  to differentiate between  SARS-CoV-2 and other Sarbecovirus currently known to infect humans.  If clinically indicated additional testing with an alternate test  methodology [phone]) is advised. The SARS-CoV-2 RNA is generally  detectable in upper and lower respiratory sp  ecimens during the acute  phase of infection. The expected result is Negative. Fact Sheet for Patients:  StrictlyIdeas.no Fact Sheet for Healthcare Providers: BankingDealers.co.za This test is not yet approved or cleared by the [place] FDA and has been authorized for detection and/or diagnosis of SARS-CoV-2 by FDA under an Emergency Use Authorization (EUA).  This EUA will remain in effect (meaning this test can be used) for the duration of the COVID-19 declaration under Section 564(b)(1) of the Act, 21 U.S.C. section 360bbb-3(b)(1), unless the authorization is terminated or revoked sooner. Performed at [facility], [place] [address] [place], [place] [address]   MRSA PCR Screening     Status: None   Collection Time: 09/23/2018 10:31 PM  Result Value Ref Range Status   MRSA by PCR NEGATIVE NEGATIVE Final    Comment:        The GeneXpert MRSA Assay (FDA approved for NASAL specimens only), is one component of a comprehensive MRSA colonization surveillance program. It is not intended to diagnose MRSA infection nor  to guide or monitor treatment for MRSA infections. Performed at [facility], [place] [address] [place], [place] [address]   Culture, Urine     Status: None   Collection Time: 09/20/18 10:09 PM  Result Value Ref Range Status   Specimen Description URINE, RANDOM  Final   Special Requests NONE  Final   Culture   Final    NO GROWTH Performed at [facility], [place] [address] [place], [place] [address]    Report Status 09/21/2018 FINAL  Final      Studies: Dg Chest Port 1 View  Result Date: 09/24/2018 CLINICAL DATA:  Shortness of breath and wheezing EXAM: PORTABLE CHEST 1 VIEW COMPARISON:  Sep 20, 2018 FINDINGS: There is soft tissue prominence in the right paratracheal region, concerning for adenopathy. There is mild right base atelectasis. No edema or consolidation is evident. There is an azygos lobe on  the right, an anatomic variant. Heart size and pulmonary vascular normal.  No bone lesions evident. IMPRESSION: Persistent soft tissue prominence in the right paratracheal region. This appearance is concerning for adenopathy. Contrast enhanced chest CT advised to further evaluate. Elsewhere, no edema or consolidation is evident. There is slight right base atelectasis. Heart size is normal. These results will be called to the ordering clinician or representative by the Radiologist Assistant, and communication documented in the PACS or zVision Dashboard. Electronically Signed   By: Lowella Grip III M.D.   On: 09/24/2018 07:57    Scheduled Meds: . collagenase   Topical Daily  . feeding supplement (ENSURE ENLIVE)  237 mL Oral TID BM  . folic acid  1 mg Oral Daily  . furosemide  40 mg Intravenous BID  . ipratropium-albuterol  3 mL Nebulization TID  . methylPREDNISolone (SOLU-MEDROL) injection  40 mg Intravenous Q12H  . mometasone-formoterol  1 puff Inhalation BID  . multivitamin with minerals  1 tablet Oral Daily  . sodium chloride flush  10-40 mL Intracatheter Q12H  . sodium chloride flush  3 mL Intravenous Q12H  . thiamine  100 mg Oral Daily    Continuous Infusions:    LOS: 7 days     Anthony Memos, MD [facility] Pager [phone]  If 7PM-7AM, please contact night-coverage [email or web address] Password [facility] 09/24/2018, 9:32 AM

## 2018-09-24 NOTE — Progress Notes (Signed)
Patient refusing BiPAP; found w/BiPAP alarming, patient had ripped off BiPAP and is sitting in fecal matter in his bedside chair w/no oxygen on at all.  RN places NRB on patient against his better judgment, swatting at RN attempting to replace NRB. Pericare provided; linens changed.  Patient agrees to stay on NRB for now.

## 2018-09-24 NOTE — Progress Notes (Signed)
Pt placed on 100% NRB per Carla Nugent, RN; sats down to 69% on O2 @ 6L/[place] humidified.  Pt just recently had a racemic epi 1634. Will continue to monitor closely.

## 2018-09-24 NOTE — Progress Notes (Signed)
Pt refused ABG.  Currently on BiPAP, 10/5, 40%, rt 12 and tolerating well.  RN aware.

## 2018-09-25 ENCOUNTER — Inpatient Hospital Stay (HOSPITAL_COMMUNITY): Payer: Medicare Other

## 2018-09-25 DIAGNOSIS — R0602 Shortness of breath: Secondary | ICD-10-CM

## 2018-09-25 DIAGNOSIS — Z515 Encounter for palliative care: Secondary | ICD-10-CM

## 2018-09-25 DIAGNOSIS — Z7189 Other specified counseling: Secondary | ICD-10-CM

## 2018-09-25 MED ORDER — MORPHINE SULFATE (PF) 2 MG/ML IV SOLN
2.0000 mg | Freq: Once | INTRAVENOUS | Status: AC
  Administered 2018-09-25: 2 mg via INTRAVENOUS
  Filled 2018-09-25: qty 1

## 2018-09-25 MED ORDER — MORPHINE SULFATE (PF) 2 MG/ML IV SOLN: 1.0000 mg | INTRAVENOUS | Status: DC | PRN

## 2018-09-25 MED ORDER — MORPHINE SULFATE (PF) 2 MG/ML IV SOLN
INTRAVENOUS | Status: AC
  Filled 2018-09-25: qty 1

## 2018-09-25 MED ORDER — METHYLPREDNISOLONE SODIUM SUCC 125 MG IJ SOLR
60.0000 mg | Freq: Three times a day (TID) | INTRAMUSCULAR | Status: DC
  Administered 2018-09-25 – 2018-09-26 (×5): 60 mg via INTRAVENOUS
  Filled 2018-09-25 (×5): qty 2

## 2018-09-25 MED ORDER — MORPHINE SULFATE (PF) 2 MG/ML IV SOLN
1.0000 mg | INTRAVENOUS | Status: DC | PRN
  Administered 2018-09-25 – 2018-09-27 (×7): 1 mg via INTRAVENOUS
  Filled 2018-09-25 (×8): qty 1

## 2018-09-25 NOTE — Progress Notes (Signed)
PMT brief progress note  Discussed earlier this am with TRH MD  Patient sitting up in chair Has NRB mask on  Bedside RN also in the room Patient received one dose of PRN IV Morphine earlier today He is trying to take breaks from NRB mask for PO intake.   Patient is awake and alert, but not able to speak in full sentences. He states breathing is "so so". Cachectic appearing Mild to moderate resp distress No edema Awake alert Regular   Please see initial consult note by colleague Ms Cousar NP  PPS 11%  68 year old heavy smoker with hemoptysis, wheezing, stridor.  Chest x-ray with suspicion for right hilar mass High suspiciin for malignancy with airway obstruction and SVC syndrome.  Not able to go for CT chest due to tenuous resp status.  He has elected for DNR DNI Continue current mode of care including current pain and non pain symptom management Call placed unable to reach daughter Although patient has expressed wish to go home with hospice, he has rapidly declining resp status and would benefit from residential hospice for symptom management, in my opinion.   15 minutes spent Loistine Chance MD [facility] health palliative medicine team [phone]

## 2018-09-25 NOTE — Progress Notes (Signed)
Patient having increased work of breathing, panicking stating that "he can't breathe" patient on 15L NRB. States he can not tolerate Bipap machine. Patient pulling off NRB, On Call MD paged one time order of morphine given. This seems to help a little. Will continue to monitor patient.

## 2018-09-25 NOTE — Progress Notes (Signed)
PROGRESS NOTE  Anthony Dorsey ZOX:[id number] DOB: Sep 02, 1950 DOA: 09/16/2018 PCP: Patient, No Pcp Per  HPI/Recap of past 24 hours: 68 y.o.malewith medical history significant of tobacco abuse Presented with 1 month of worsening dyspnea, he got tired of feeling like that and came in to ER. Reports wheezing ongoing tobacco abuse 1.5 pack/day. Symptom onset gradual nothing seems to make it better reports productive cough and occasional chest pain while coughing but no chest pain at baseline. No fevers or chills.  Chest x-ray done on 09/20/2018 with concern for right paratracheal mass or adenopathy.  Chest CT recommended but patient declines multiple times. Worsening wheezes now on racemic epi as needed.  09/25/18: Patient seen and examined at his bedside.  Respiratory distress overnight, refused BiPAP and agreed to nonrebreather.  This morning sitting in his bed side chair on nonrebreather lethargic but arousable to voices.  Per PCCM note patient has agreed to transition to full palliation and hospice care. Called daughter several times but unable to reach. Left a message. Will call again to make aware and confirm comfort care.   Assessment/Plan: Active Problems:   COPD with acute exacerbation (HCC)   Tobacco abuse   Acute respiratory failure with hypoxia (HCC)   Hypercalcemia   Lung mass   Prolonged QT interval   COPD exacerbation (HCC)   Alcohol abuse   Hemoptysis   Pressure injury of skin  COPD with acute exacerbation and acute hypoxemic respiratory failure (HCC) -  -Persistent audible wheezing on exam with conversational dyspnea -Continue racemic epi every 4 hours PRN for wheezing -Completed 5 days of Rocephin and azithro - Continue with XopenexPRN with scheduled duoneb - Breo or Dulera at discharge - C/w solumedrol 60 mg TID - Continues to decline CT chest with contrast.  Also declines sedation/anxiolytic to assist with discomfort. - Maintain O2 sat >90% -  Currently on NRB  Acute hypoxic respiratory failure likely multifactorial secondary to COPD exacerbation versus possible lung mass On NRB Worsening respiratory status overnight Declines ABG or BIPAP COVID-19 negative Management stated above Maintain O2 sat >90% IV morphine added for air hunger Pt discussed w PCCM on 09/24/18 and agreed to transition to palliative/hospice care. Called daughter to make aware and confirm, left a message. Will call again to confirm CMO   Suspected Hilar mass on CXR Patient declines CT chest to further assess these findings stating he does not want to lay down flat but refuses sedation or anxiolytics He declines any aggressive evaluation or treatment  Medical noncompliance Patient is alert and oriented x3 Noncompliant with medical management  Tobacco abuse - -Tobacco cessation counseling at bedside -Continue with nicotine patch as tolerated  Worsening hypercalcemia -Calcium 11.4 from 10.7  -Was on IV fluid  Community-acquired pneumonia  -Patient had refused CT chest thus far Intermittent hemoptysis reported Completed 5 days of Rocephin and azithromycin Procalcitonin and lactic acid done on 09/24/2018 unremarkable. IV antibiotics DC'd.  Resolved prolonged QT interval  -will monitor on tele avoid QT prolonging medications, rehydrate correct electrolytes as needed -Most recent QTc of 411 on recent EKG  Alcohol abuse with concern for withdrawal - Completed CIWA protocol - Without evidence of withdrawal at this time  BLE edema -BLE edema noted on exam -Denies tenderness on palpation -2d echo done on 09/21/2018 shows normal LVEF  Ambulatory dysfunction/physical debility Has declined occupational therapy while inpatient  Severe protein calorie malnutrition BMI 15 Encourage increase protein oral calorie intake Oral supplement Obtain CMP mag and phosphate  Unstageable pressure  injury to left trochanter. Present on admission.  Wound  type:unstageable pressure injury  Pressure Injury POA: Yes  Measurement: 3 cm x 3 cm unable to view wound bed due to the presence of eschar  Wound bed:25% pale pink and 75% soft gray slough  Drainage (amount, consistency, odor) minimal serosanguinous No odor  Periwound:blanchable erythema  Dressing procedure/placement/frequency:Cleanse left hip wound with NS and pat dry. Apply Santyl to wound bed. Cover with NS moist 2x2. Cover with foam dressing. Change daily.    DVT prophylaxis: SCD's Code Status: DNR Family Communication: Called daughter on 09/25/18 and left message. Will call again. Disposition Plan:  Poor prognosis; Possible home with hospice or hospice residency.  Consultants:   Palliative care team  Pulmonology  Procedures:    None   Objective: Vitals:   09/25/18 0315 09/25/18 0445 09/25/18 0729 09/25/18 0823  BP: (!) 153/78   136/79  Pulse: (!) 113     Resp: 20   18  Temp: (!) 96.9 F (36.1 C)   97.9 F (36.6 C)  TempSrc: Axillary   Oral  SpO2: 98% 97% 100%   Weight:      Height:        Intake/Output Summary (Last 24 hours) at 09/25/2018 0944 Last data filed at 09/25/2018 9381 Gross per 24 hour  Intake 1385.66 ml  Output 425 ml  Net 960.66 ml   Filed Weights   09/16/2018 1245 09/18/18 0453 09/24/18 0120  Weight: 63.5 kg 47.8 kg 46.1 kg    Exam:  . General: 68 y.o. year-old male severely emaciated with use of accessory muscles to breathe. Lethargic but arousable to voices. . Cardiovascular: RRR no rubs or gallops. Left JVD. No thyromegaly. Marland Kitchen Respiratory: Diffuse wheezes bilaterally.  Poor inspiratory effort. . Abdomen: Soft nontender nondistended with normal bowel sounds x4 quadrants. . Musculoskeletal: Trace edema in lower extremities bilaterally. . Skin: Very dry skin affecting lower extremities.   Marland Kitchen Psychiatry: Unable to assess mood due to lethargy.   Data Reviewed: CBC: Recent Labs  Lab 09/20/18 1410 09/22/18 0227 09/24/18 0419   WBC 9.0 12.8* 18.7*  NEUTROABS  --   --  16.8*  HGB 10.9* 10.8* 11.0*  HCT 34.4* 34.5* 34.0*  MCV 93.2 93.0 92.9  PLT 477* 488* 017*   Basic Metabolic Panel: Recent Labs  Lab 09/20/18 1410 09/22/18 0227 09/24/18 0419  NA 135 141 140  K 3.9 3.9 4.2  CL 96* 96* 95*  CO2 33* 33* 36*  GLUCOSE 124* 138* 111*  BUN 11 21 21   CREATININE 0.50* 0.59* 0.54*  CALCIUM 10.3 10.7* 11.4*  MG  --   --  2.1  PHOS  --   --  4.0   GFR: Estimated Creatinine Clearance: 60 mL/min (A) (by C-G formula based on SCr of 0.54 mg/dL (L)). Liver Function Tests: Recent Labs  Lab 09/24/18 0419  AST 40  ALT 72*  ALKPHOS 89  BILITOT 0.1*  PROT 6.6  ALBUMIN 3.0*   No results for input(s): LIPASE, AMYLASE in the last 168 hours. No results for input(s): AMMONIA in the last 168 hours. Coagulation Profile: No results for input(s): INR, PROTIME in the last 168 hours. Cardiac Enzymes: No results for input(s): CKTOTAL, CKMB, CKMBINDEX, TROPONINI in the last 168 hours. BNP (last 3 results) No results for input(s): PROBNP in the last 8760 hours. HbA1C: No results for input(s): HGBA1C in the last 72 hours. CBG: Recent Labs  Lab 09/23/18 1111  GLUCAP 111*   Lipid Profile: No  results for input(s): CHOL, HDL, LDLCALC, TRIG, CHOLHDL, LDLDIRECT in the last 72 hours. Thyroid Function Tests: No results for input(s): TSH, T4TOTAL, FREET4, T3FREE, THYROIDAB in the last 72 hours. Anemia Panel: No results for input(s): VITAMINB12, FOLATE, FERRITIN, TIBC, IRON, RETICCTPCT in the last 72 hours. Urine analysis: No results found for: COLORURINE, APPEARANCEUR, LABSPEC, PHURINE, GLUCOSEU, HGBUR, BILIRUBINUR, KETONESUR, PROTEINUR, UROBILINOGEN, NITRITE, LEUKOCYTESUR Sepsis Labs: @LABRCNTIP (procalcitonin:4,lacticidven:4)  ) Recent Results (from the past 240 hour(s))  SARS Coronavirus 2 (CEPHEID- Performed in [place] hospital lab), Hosp Order     Status: None   Collection Time: 09/24/2018  1:17 PM  Result Value  Ref Range Status   SARS Coronavirus 2 NEGATIVE NEGATIVE Final    Comment: (NOTE) If result is NEGATIVE SARS-CoV-2 target nucleic acids are NOT DETECTED. The SARS-CoV-2 RNA is generally detectable in upper and lower  respiratory specimens during the acute phase of infection. The lowest  concentration of SARS-CoV-2 viral copies this assay can detect is 250  copies / mL. A negative result does not preclude SARS-CoV-2 infection  and should not be used as the sole basis for treatment or other  patient management decisions.  A negative result may occur with  improper specimen collection / handling, submission of specimen other  than nasopharyngeal swab, presence of viral mutation(s) within the  areas targeted by this assay, and inadequate number of viral copies  (<250 copies / mL). A negative result must be combined with clinical  observations, patient history, and epidemiological information. If result is POSITIVE SARS-CoV-2 target nucleic acids are DETECTED. The SARS-CoV-2 RNA is generally detectable in upper and lower  respiratory specimens dur ing the acute phase of infection.  Positive  results are indicative of active infection with SARS-CoV-2.  Clinical  correlation with patient history and other diagnostic information is  necessary to determine patient infection status.  Positive results do  not rule out bacterial infection or co-infection with other viruses. If result is PRESUMPTIVE POSTIVE SARS-CoV-2 nucleic acids MAY BE PRESENT.   A presumptive positive result was obtained on the submitted specimen  and confirmed on repeat testing.  While 2019 novel coronavirus  (SARS-CoV-2) nucleic acids may be present in the submitted sample  additional confirmatory testing may be necessary for epidemiological  and / or clinical management purposes  to differentiate between  SARS-CoV-2 and other Sarbecovirus currently known to infect humans.  If clinically indicated additional testing with an  alternate test  methodology [phone]) is advised. The SARS-CoV-2 RNA is generally  detectable in upper and lower respiratory sp ecimens during the acute  phase of infection. The expected result is Negative. Fact Sheet for Patients:  StrictlyIdeas.no Fact Sheet for Healthcare Providers: BankingDealers.co.za This test is not yet approved or cleared by the [place] FDA and has been authorized for detection and/or diagnosis of SARS-CoV-2 by FDA under an Emergency Use Authorization (EUA).  This EUA will remain in effect (meaning this test can be used) for the duration of the COVID-19 declaration under Section 564(b)(1) of the Act, 21 U.S.C. section 360bbb-3(b)(1), unless the authorization is terminated or revoked sooner. Performed at [facility], [place] [address] [place], [place] [address]   MRSA PCR Screening     Status: None   Collection Time: 09/19/2018 10:31 PM  Result Value Ref Range Status   MRSA by PCR NEGATIVE NEGATIVE Final    Comment:        The GeneXpert MRSA Assay (FDA approved for NASAL specimens only), is one component  of a comprehensive MRSA colonization surveillance program. It is not intended to diagnose MRSA infection nor to guide or monitor treatment for MRSA infections. Performed at [facility], [place] [address], [place], [place] [address]   Culture, Urine     Status: None   Collection Time: 09/20/18 10:09 PM  Result Value Ref Range Status   Specimen Description URINE, RANDOM  Final   Special Requests NONE  Final   Culture   Final    NO GROWTH Performed at [facility], [place] [address] [place], [place] [address]    Report Status 09/21/2018 FINAL  Final      Studies: Dg Chest Port 1 View  Result Date: 09/25/2018 CLINICAL DATA:  Respiratory failure EXAM: PORTABLE CHEST 1 VIEW COMPARISON:  09/24/2018 FINDINGS: Apical lordotic view. Stable soft tissue in the right suprahilar region. Lungs  otherwise clear. No pleural effusion or pneumothorax. The heart is normal in size. IMPRESSION: No evidence of acute cardiopulmonary disease. Stable soft tissue in the right suprahilar region. As previously noted, consider CT chest with contrast for further evaluation if clinically warranted. Electronically Signed   By: Julian Hy M.D.   On: 09/25/2018 08:44    Scheduled Meds: . collagenase   Topical Daily  . enoxaparin (LOVENOX) injection  40 mg Subcutaneous Q24H  . feeding supplement (ENSURE ENLIVE)  237 mL Oral TID BM  . folic acid  1 mg Oral Daily  . ipratropium-albuterol  3 mL Nebulization TID  . methylPREDNISolone (SOLU-MEDROL) injection  60 mg Intravenous TID  . mometasone-formoterol  1 puff Inhalation BID  . morphine      . multivitamin with minerals  1 tablet Oral Daily  . sodium chloride flush  10-40 mL Intracatheter Q12H  . sodium chloride flush  3 mL Intravenous Q12H  . thiamine  100 mg Oral Daily    Continuous Infusions:    LOS: 8 days     Kayleen Memos, MD [facility] Pager [phone]  If 7PM-7AM, please contact night-coverage [email or web address] Password [facility] 09/25/2018, 9:44 AM

## 2018-09-25 NOTE — Progress Notes (Signed)
Physical Therapy Re-Evaluation and Treatment Patient Details Name: Anthony Dorsey MRN: [id number] DOB: 10-Jan-1951 Today's Date: 09/25/2018    History of Present Illness Anthony Dorsey is a 68 year old gentleman with history of tobacco abuse who presents with progressive shortness of breath and hemoptysis. Admitted 5/22 for treatment of COPD, Chest x-ray done on 09/20/2018 with concern for right paratracheal mass or adenopathy. Pt refuses CT, MRI, and BiPAP. Pt currently on NRB    PT Comments    Pt has had a decrease in strength and mobility since last PT session due to [facility] and 4/4 DoE with ambulation to/from bathroom. Pt is currently min A for transfers and ambulation with RW. Pt on NRB with SaO2 >96%O2 throughout session. Pt is being followed by Palliative and possible will be transferred to [facility]. If not pt will need SNF level care at d/c. PT will continue to follow acutely.   Follow Up Recommendations  SNF     Equipment Recommendations  Rolling walker with 5" wheels       Precautions / Restrictions Precautions Precautions: Fall Restrictions Weight Bearing Restrictions: No    Mobility  Bed Mobility               General bed mobility comments: OOB on entry   Transfers Overall transfer level: Needs assistance   Transfers: Sit to/from Stand Sit to Stand: Min assist         General transfer comment: minA for power up and steadying with RW  Ambulation/Gait Ambulation/Gait assistance: Min assist Gait Distance (Feet): 18 Feet Assistive device: Rolling walker (2 wheeled) Gait Pattern/deviations: Step-through pattern;Decreased step length - right;Decreased step length - left;Trunk flexed;Shuffle Gait velocity: slowed Gait velocity interpretation: <1.31 ft/sec, indicative of household ambulator General Gait Details: minA for steadying with slow, shuffling gait to and from bathroom         Balance Overall balance assessment: Needs  assistance Sitting-balance support: No upper extremity supported;Feet supported Sitting balance-Leahy Scale: Fair     Standing balance support: During functional activity;Single extremity supported Standing balance-Leahy Scale: Poor Standing balance comment: requires at least single UE support to maintain balance                            Cognition Arousal/Alertness: Awake/alert Behavior During Therapy: WFL for tasks assessed/performed Overall Cognitive Status: Within Functional Limits for tasks assessed                                           General Comments General comments (skin integrity, edema, etc.): Pt on 15L NRB, SaO2 >98%. Pt with 4/4 DoE with ambulation to/from bathroom requesting "more air" despite SaO2 98%O2      Pertinent Vitals/Pain Pain Assessment: No/denies pain    Home Living Family/patient expects to be discharged to:: Private residence Living Arrangements: Alone   Type of Home: Other(Comment) Home Access: Level entry   Home Layout: One level Home Equipment: None      Prior Function Level of Independence: Independent      Comments: does own cooking and cleaning. does not drive   PT Goals (current goals can now be found in the care plan section) Acute Rehab PT Goals Patient Stated Goal: get the weezing to stop PT Goal Formulation: With patient Progress towards PT goals: Not progressing toward goals - comment(decrease in strength and mobility)  Frequency    Min 3X/week      PT Plan Discharge plan needs to be updated       AM-PAC PT "6 Clicks" Mobility   Outcome Measure  Help needed turning from your back to your side while in a flat bed without using bedrails?: A Little Help needed moving from lying on your back to sitting on the side of a flat bed without using bedrails?: A Little Help needed moving to and from a bed to a chair (including a wheelchair)?: A Little Help needed standing up from a chair  using your arms (e.g., wheelchair or bedside chair)?: A Little Help needed to walk in hospital room?: A Little Help needed climbing 3-5 steps with a railing? : A Lot 6 Click Score: 17    End of Session Equipment Utilized During Treatment: Gait belt;Oxygen Activity Tolerance: Patient limited by fatigue Patient left: in chair;with call bell/phone within reach;with chair alarm set Nurse Communication: Mobility status PT Visit Diagnosis: Muscle weakness (generalized) (M62.81)     Time: 9532-0233 PT Time Calculation (min) (ACUTE ONLY): 22 min  Charges:                        Anthony Dorsey PT, DPT Acute Rehabilitation Services Pager [phone] Office [phone]    Anthony Dorsey 09/25/2018, 1:12 PM

## 2018-09-26 MED ORDER — METHYLPREDNISOLONE SODIUM SUCC 125 MG IJ SOLR
60.0000 mg | Freq: Two times a day (BID) | INTRAMUSCULAR | Status: DC
  Administered 2018-09-27: 05:00:00 60 mg via INTRAVENOUS
  Filled 2018-09-26: qty 2

## 2018-09-26 MED ORDER — IPRATROPIUM BROMIDE 0.02 % IN SOLN
0.5000 mg | Freq: Four times a day (QID) | RESPIRATORY_TRACT | Status: DC
  Administered 2018-09-26 – 2018-09-27 (×3): 0.5 mg via RESPIRATORY_TRACT
  Filled 2018-09-26 (×3): qty 2.5

## 2018-09-26 MED ORDER — LEVALBUTEROL HCL 0.63 MG/3ML IN NEBU: 0.6300 mg | INHALATION_SOLUTION | Freq: Four times a day (QID) | RESPIRATORY_TRACT | Status: DC

## 2018-09-26 MED ORDER — LEVALBUTEROL HCL 0.63 MG/3ML IN NEBU
0.6300 mg | INHALATION_SOLUTION | Freq: Four times a day (QID) | RESPIRATORY_TRACT | Status: DC
  Administered 2018-09-26 – 2018-09-27 (×3): 0.63 mg via RESPIRATORY_TRACT
  Filled 2018-09-26 (×3): qty 3

## 2018-09-26 MED ORDER — IPRATROPIUM BROMIDE 0.02 % IN SOLN: 0.5000 mg | Freq: Three times a day (TID) | RESPIRATORY_TRACT | Status: DC

## 2018-09-26 MED ORDER — LEVALBUTEROL HCL 0.63 MG/3ML IN NEBU: 0.6300 mg | INHALATION_SOLUTION | Freq: Three times a day (TID) | RESPIRATORY_TRACT | Status: DC

## 2018-09-26 MED ORDER — IPRATROPIUM BROMIDE 0.02 % IN SOLN: 0.5000 mg | Freq: Four times a day (QID) | RESPIRATORY_TRACT | Status: DC

## 2018-09-26 NOTE — Progress Notes (Signed)
PROGRESS NOTE  Anthony Dorsey JGO:[id number] DOB: [birth year] DOA: 09/05/2018 PCP: Patient, No Pcp Per  HPI/Recap of past 24 hours: 68 y.o.malewith medical history significant of tobacco abuse Presented with 1 month of worsening dyspnea, he got tired of feeling like that and came in to ER. Reports wheezing ongoing tobacco abuse 1.5 pack/day. Symptom onset gradual nothing seems to make it better reports productive cough and occasional chest pain while coughing but no chest pain at baseline. No fevers or chills.  Chest x-ray done on 09/20/2018 with concern for right paratracheal mass or adenopathy.  Chest CT recommended but patient declines multiple times. Worsening wheezes now on racemic epi as needed.  09/25/18: Patient seen and examined at his bedside.  Respiratory distress overnight, refused BiPAP and agreed to nonrebreather.  This morning sitting in his bed side chair on nonrebreather lethargic but arousable to voices.  Per PCCM note patient has agreed to transition to full palliation and hospice care. Called daughter several times but unable to reach. Left a message. Will call again to make aware and confirm comfort care.  09/26/18: Patient seen and examined at bedside.  Continue to be on the nonrebreather with increase work of breathing.  Does not answer questions during interview.  Palliative care is following for possible residential hospice placement.  Called his daughter several times and have not been able to reach her.   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  Patient declines CT chest to further assess these findings stating he does not want to lay down flat but refuses sedation or anxiolytics He declines any aggressive evaluation or treatment Palliative care following Unable to reach his daughter Possible residential hospice if agreeable  COPD with acute exacerbation and acute hypoxemic respiratory failure (HCC) -  -Persistent audible wheezing on exam with conversational dyspnea -Continue racemic epi every 4 hours PRN for wheezing -Completed 5 days of Rocephin and azithro - Continue with XopenexPRN with scheduled duoneb - Breo or Dulera at discharge - C/w solumedrol 60 mg TID - Continues to decline CT chest with contrast.  Also declines sedation/anxiolytic to assist with discomfort. - Maintain O2 sat >90% - Currently on NRB  Acute hypoxic respiratory failure likely multifactorial secondary to COPD exacerbation versus possible lung mass On NRB Worsening respiratory status overnight Declines ABG or BIPAP COVID-19 negative Management stated above Maintain O2 sat >90% IV morphine added for air hunger Pt discussed w PCCM on 09/24/18 and agreed to transition to palliative/hospice care. Called daughter to make aware and confirm, left a message. Will call again to confirm CMO   Leukocytosis Likely reactive on IV steroids Urine culture no growth Procalcitonin negative Chest x-ray no evidence of acute cardiopulmonary disease  Medical noncompliance Patient is alert and oriented x3 Noncompliant with medical management  Tobacco abuse - -Tobacco cessation counseling at bedside -Continue with nicotine patch as tolerated  Worsening hypercalcemia -Calcium 11.4 from 10.7  -Was on IV fluid  Community-acquired pneumonia  -Patient had refused CT chest thus far Intermittent hemoptysis reported Completed 5 days of Rocephin  and azithromycin Procalcitonin and lactic acid done on 09/24/2018 unremarkable. IV antibiotics DC'd.   Resolved prolonged QT interval  -will monitor on tele avoid QT prolonging medications, rehydrate correct electrolytes as needed -Most recent QTc of 411 on recent EKG  Alcohol abuse with concern for withdrawal - Completed CIWA protocol - Without evidence of withdrawal at this time  BLE edema -BLE edema noted on exam -Denies tenderness on palpation -2d echo done on 09/21/2018 shows normal LVEF  Ambulatory dysfunction/physical debility Has declined occupational therapy while inpatient  Severe protein calorie malnutrition BMI 15 Encourage increase protein oral calorie intake Oral supplement Obtain CMP mag and phosphate  Unstageable pressure injury to left trochanter. Present on admission.  Wound type:unstageable pressure injury  Pressure Injury POA: Yes  Measurement: 3 cm x 3 cm unable to view wound bed due to the presence of eschar  Wound bed:25% pale pink and 75% soft gray slough  Drainage (amount, consistency, odor) minimal serosanguinous No odor  Periwound:blanchable erythema  Dressing procedure/placement/frequency:Cleanse left hip wound with NS and pat dry. Apply Santyl to wound bed. Cover with NS moist 2x2. Cover with foam dressing. Change daily.    DVT prophylaxis: SCD's Code Status: DNR Family Communication: Called daughter on 09/25/18 and left message. Will call again. Disposition Plan:  Poor prognosis; Possible home with hospice or hospice residency.  Consultants:   Palliative care team  Pulmonology  Procedures:    None   Objective: Vitals:   09/25/18 2300 09/26/18 0738 09/26/18 0815 09/26/18 1035  BP: 129/88 (!) 89/77  111/77  Pulse: (!) 111 (!) 108  (!) 108  Resp: (!) 24 18  12   Temp: 98.2 F (36.8 C) 97.6 F (36.4 C)    TempSrc: Oral Oral    SpO2: 100% 100% 92% 100%  Weight:      Height:        Intake/Output Summary (Last 24 hours) at 09/26/2018 1103 Last data filed at 09/26/2018 1245 Gross per 24 hour  Intake 647 ml  Output 975  ml  Net -328 ml   Filed Weights   09/22/2018 1245 09/18/18 0453 09/24/18 0120  Weight: 63.5 kg 47.8 kg 46.1 kg    Exam:  . General: 68 y.o. year-old male cachectic with use of accessory muscles to breathe.  Does not participate in physical exam. . Cardiovascular: Regular rate and rhythm with no rubs or gallops.  Legs JVD noted. Marland Kitchen Respiratory: Diffuse wheezes bilaterally with poor inspiratory effort. . Abdomen: Soft nontender nondistended with normal bowel sounds x4 quadrants.   . Musculoskeletal: Trace lower extremity edema bilaterally. . Skin: Dry skin noted affecting lower extremities bilaterally. Marland Kitchen Psychiatry: Irritable mood.   Data Reviewed: CBC: Recent Labs  Lab 09/20/18 1410 09/22/18 0227 09/24/18 0419  WBC 9.0 12.8* 18.7*  NEUTROABS  --   --  16.8*  HGB 10.9* 10.8* 11.0*  HCT 34.4* 34.5* 34.0*  MCV 93.2 93.0 92.9  PLT 477* 488* 809*   Basic Metabolic Panel: Recent Labs  Lab 09/20/18 1410 09/22/18 0227 09/24/18 0419  NA 135 141 140  K 3.9 3.9 4.2  CL 96* 96* 95*  CO2 33* 33* 36*  GLUCOSE 124* 138* 111*  BUN 11 21 21   CREATININE 0.50* 0.59* 0.54*  CALCIUM 10.3 10.7* 11.4*  MG  --   --  2.1  PHOS  --   --  4.0   GFR: Estimated Creatinine Clearance: 60 mL/min (A) (by C-G formula based on SCr of 0.54 mg/dL (L)). Liver Function Tests: Recent  Labs  Lab 09/24/18 0419  AST 40  ALT 72*  ALKPHOS 89  BILITOT 0.1*  PROT 6.6  ALBUMIN 3.0*   No results for input(s): LIPASE, AMYLASE in the last 168 hours. No results for input(s): AMMONIA in the last 168 hours. Coagulation Profile: No results for input(s): INR, PROTIME in the last 168 hours. Cardiac Enzymes: No results for input(s): CKTOTAL, CKMB, CKMBINDEX, TROPONINI in the last 168 hours. BNP (last 3 results) No results for input(s): PROBNP in the last 8760 hours. HbA1C: No results for input(s): HGBA1C in the last 72 hours. CBG: Recent Labs  Lab 09/23/18 1111  GLUCAP 111*   Lipid Profile: No  results for input(s): CHOL, HDL, LDLCALC, TRIG, CHOLHDL, LDLDIRECT in the last 72 hours. Thyroid Function Tests: No results for input(s): TSH, T4TOTAL, FREET4, T3FREE, THYROIDAB in the last 72 hours. Anemia Panel: No results for input(s): VITAMINB12, FOLATE, FERRITIN, TIBC, IRON, RETICCTPCT in the last 72 hours. Urine analysis: No results found for: COLORURINE, APPEARANCEUR, LABSPEC, PHURINE, GLUCOSEU, HGBUR, BILIRUBINUR, KETONESUR, PROTEINUR, UROBILINOGEN, NITRITE, LEUKOCYTESUR Sepsis Labs: @LABRCNTIP (procalcitonin:4,lacticidven:4)  ) Recent Results (from the past 240 hour(s))  SARS Coronavirus 2 (CEPHEID- Performed in [facility] hospital lab), Hosp Order     Status: None   Collection Time: 09/23/2018  1:17 PM  Result Value Ref Range Status   SARS Coronavirus 2 NEGATIVE NEGATIVE Final    Comment: (NOTE) If result is NEGATIVE SARS-CoV-2 target nucleic acids are NOT DETECTED. The SARS-CoV-2 RNA is generally detectable in upper and lower  respiratory specimens during the acute phase of infection. The lowest  concentration of SARS-CoV-2 viral copies this assay can detect is 250  copies / mL. A negative result does not preclude SARS-CoV-2 infection  and should not be used as the sole basis for treatment or other  patient management decisions.  A negative result may occur with  improper specimen collection / handling, submission of specimen other  than nasopharyngeal swab, presence of viral mutation(s) within the  areas targeted by this assay, and inadequate number of viral copies  (<250 copies / mL). A negative result must be combined with clinical  observations, patient history, and epidemiological information. If result is POSITIVE SARS-CoV-2 target nucleic acids are DETECTED. The SARS-CoV-2 RNA is generally detectable in upper and lower  respiratory specimens dur ing the acute phase of infection.  Positive  results are indicative of active infection with SARS-CoV-2.  Clinical   correlation with patient history and other diagnostic information is  necessary to determine patient infection status.  Positive results do  not rule out bacterial infection or co-infection with other viruses. If result is PRESUMPTIVE POSTIVE SARS-CoV-2 nucleic acids MAY BE PRESENT.   A presumptive positive result was obtained on the submitted specimen  and confirmed on repeat testing.  While 2019 novel coronavirus  (SARS-CoV-2) nucleic acids may be present in the submitted sample  additional confirmatory testing may be necessary for epidemiological  and / or clinical management purposes  to differentiate between  SARS-CoV-2 and other Sarbecovirus currently known to infect humans.  If clinically indicated additional testing with an alternate test  methodology [phone]) is advised. The SARS-CoV-2 RNA is generally  detectable in upper and lower respiratory sp ecimens during the acute  phase of infection. The expected result is Negative. Fact Sheet for Patients:  StrictlyIdeas.no Fact Sheet for Healthcare Providers: BankingDealers.co.za This test is not yet approved or cleared by the [place] FDA and has been authorized for detection and/or diagnosis of SARS-CoV-2 by  FDA under an Emergency Use Authorization (EUA).  This EUA will remain in effect (meaning this test can be used) for the duration of the COVID-19 declaration under Section 564(b)(1) of the Act, 21 U.S.C. section 360bbb-3(b)(1), unless the authorization is terminated or revoked sooner. Performed at [facility], [place] [address], [place], [place] [address]   MRSA PCR Screening     Status: None   Collection Time: 08/29/2018 10:31 PM  Result Value Ref Range Status   MRSA by PCR NEGATIVE NEGATIVE Final    Comment:        The GeneXpert MRSA Assay (FDA approved for NASAL specimens only), is one component of a comprehensive MRSA colonization surveillance program. It is not  intended to diagnose MRSA infection nor to guide or monitor treatment for MRSA infections. Performed at [facility], [place] [address] [place], [place] [address]   Culture, Urine     Status: None   Collection Time: 09/20/18 10:09 PM  Result Value Ref Range Status   Specimen Description URINE, RANDOM  Final   Special Requests NONE  Final   Culture   Final    NO GROWTH Performed at [facility], [place] [address] [place], [place] [address]    Report Status 09/21/2018 FINAL  Final      Studies: No results found.  Scheduled Meds: . collagenase   Topical Daily  . enoxaparin (LOVENOX) injection  40 mg Subcutaneous Q24H  . feeding supplement (ENSURE ENLIVE)  237 mL Oral TID BM  . folic acid  1 mg Oral Daily  . ipratropium-albuterol  3 mL Nebulization TID  . methylPREDNISolone (SOLU-MEDROL) injection  60 mg Intravenous TID  . mometasone-formoterol  1 puff Inhalation BID  . multivitamin with minerals  1 tablet Oral Daily  . sodium chloride flush  10-40 mL Intracatheter Q12H  . sodium chloride flush  3 mL Intravenous Q12H  . thiamine  100 mg Oral Daily    Continuous Infusions:    LOS: 9 days     Kayleen Memos, MD [facility] Pager [phone]  If 7PM-7AM, please contact night-coverage [email or web address] Password [email or web address] 09/26/2018, 11:03 AM

## 2018-09-26 NOTE — Progress Notes (Signed)
PMT follow up progress note   Mr Anthony Dorsey is awake, alert, sitting in a chair. He still has NRB mask on. He is resting his head on the tray table in front of him.    Med history noted: Patient has required several doses of  PRN IV Morphine in the past 24 hours.  He is trying to take breaks from NRB mask for PO intake.   Patient is awake and alert, but not able to speak in full sentences. He states he kind of rested off/on all night. Still with slight increased work of breathing.  Cachectic appearing Mild to moderate resp distress No edema Awake alert Regular  Patient refuses physical exam, when I asked his permission to listen to his lungs, he says,"I told you no."  Please see initial consult note by colleague Ms Cousar NP  PPS 73%  31 year old heavy smoker with hemoptysis, wheezing, stridor.  Chest x-ray with suspicion for right hilar mass High suspicion for malignancy with airway obstruction and SVC syndrome.  Not able to go for CT chest due to ongoing tenuous resp status.  He has elected for DNR DNI: Agree Continue current mode of care including current pain and non pain symptom management Call placed unable to reach daughter again this am on 09-26-2018. Although patient has expressed wish to go home with hospice, he has rapidly declining resp status and would benefit from residential hospice for symptom management, in my opinion.   I tried to initiate discussions with Mr Ginger about his overall goals of care, in particular, I had wished to discuss with him about residential hospice, unable to have that discussion with him today, patient declines to participate and wishes to be allowed to continue to rest.   25 minutes spent Loistine Chance MD [facility] health palliative medicine team [phone]

## 2018-09-27 MED ORDER — ZOLPIDEM TARTRATE 5 MG PO TABS: 5.0000 mg | ORAL_TABLET | Freq: Every evening | ORAL | Status: DC | PRN

## 2018-09-27 MED ORDER — MORPHINE SULFATE (CONCENTRATE) 10 MG/0.5ML PO SOLN: 5.0000 mg | ORAL | Status: DC | PRN

## 2018-09-27 MED ORDER — WHITE PETROLATUM EX OINT
TOPICAL_OINTMENT | CUTANEOUS | Status: AC
  Administered 2018-09-27: 04:00:00
  Filled 2018-09-27: qty 28.35

## 2018-09-27 MED ORDER — MORPHINE SULFATE (PF) 2 MG/ML IV SOLN
1.0000 mg | INTRAVENOUS | Status: DC | PRN
  Administered 2018-09-27: 14:00:00 2 mg via INTRAVENOUS
  Filled 2018-09-27: qty 1

## 2018-09-27 MED ORDER — IPRATROPIUM-ALBUTEROL 0.5-2.5 (3) MG/3ML IN SOLN: 3.0000 mL | RESPIRATORY_TRACT | Status: DC | PRN

## 2018-09-27 MED ORDER — LORAZEPAM 2 MG/ML IJ SOLN
1.0000 mg | INTRAMUSCULAR | Status: DC | PRN
  Administered 2018-09-27: 14:00:00 1 mg via INTRAVENOUS
  Filled 2018-09-27 (×2): qty 1

## 2018-09-27 MED ORDER — LORAZEPAM 2 MG/ML IJ SOLN
INTRAMUSCULAR | Status: AC
  Administered 2018-09-27: 1 mg via INTRAVENOUS
  Filled 2018-09-27: qty 1

## 2018-09-27 MED ORDER — LORAZEPAM 2 MG/ML IJ SOLN
1.0000 mg | Freq: Once | INTRAMUSCULAR | Status: AC
  Administered 2018-09-27: 11:00:00 1 mg via INTRAVENOUS

## 2018-09-27 MED ORDER — MORPHINE SULFATE (PF) 2 MG/ML IV SOLN
1.0000 mg | Freq: Once | INTRAVENOUS | Status: DC
  Filled 2018-09-27: qty 1

## 2018-09-27 MED ORDER — MORPHINE 100MG IN NS 100ML (1MG/ML) PREMIX INFUSION
2.0000 mg/h | INTRAVENOUS | Status: DC
  Filled 2018-09-27: qty 100

## 2018-09-27 MED ORDER — MORPHINE SULFATE (PF) 2 MG/ML IV SOLN: 2.0000 mg | Freq: Once | INTRAVENOUS | Status: DC

## 2018-09-27 MED ORDER — LORAZEPAM 2 MG/ML IJ SOLN
0.5000 mg | Freq: Four times a day (QID) | INTRAMUSCULAR | Status: DC | PRN
  Administered 2018-09-27: 0.5 mg via INTRAVENOUS
  Filled 2018-09-27: qty 1

## 2018-09-27 DEATH — deceased

## 2018-10-27 NOTE — Progress Notes (Signed)
Decreased patient from an FIO2 of 100% per nonrebreather to FIO2 of 50% via venturi mask due to 100% SATs.

## 2018-10-27 NOTE — Progress Notes (Signed)
PMT follow up progress note   Mr Doenges is  sitting in a chair. He still has NRB mask on. He is resting his head on the tray table in front of him. Does not arouse to gentle voice command, but appears to be breathing comfortably at the moment.   Med history noted: Patient has required several doses of  PRN IV Morphine in the past 24 hours.  He is trying to take breaks from NRB mask for PO intake.   Patient has ongoing escalating symptom burden, namely shortness of breath and air hunger and anxiety, also has had increased work of breathing.  Cachectic appearing Mild to moderate resp distress No edema Resting at the moment Regular  Patient does not arouse to gentle voice command, patient was restless and irritable a few minutes ago, received Ativan and is resting, I have discussed with bedside RN.     Please see initial consult note by colleague Ms Cousar NP  PPS 40%  67 year old heavy smoker with hemoptysis, wheezing, stridor.  Chest x-ray with suspicion for right hilar mass High suspicion for malignancy with airway obstruction and SVC syndrome.  Not able to go for CT chest due to ongoing tenuous resp status.  He has elected for DNR DNI: Agree Continue current mode of care including current pain and non pain symptom management Call placed unable to reach daughter again, multiple attempts have been made by several providers.  Although patient has expressed wish to go home with hospice, he has rapidly declining resp status and would benefit from residential hospice for symptom management, in my opinion, how ever, patient has not been able to participate in such discussions.   We will then continue to maximize his symptom management, discussed with bedside RN, optimize his benzodiazepine and opioid regimen, primary goal at this point is comfort measures. Patient does appear to have a limited prognosis, likely few days at this point, in my opinion.    25 minutes spent Loistine Chance MD [facility]  health palliative medicine team [phone]

## 2018-10-27 NOTE — Progress Notes (Signed)
Patient continues to have episodes of air hunger and panic. States that "I can't breathe". During these episodes O2 sats are around 86-95. Morphine seems to be helping. Will continue to monitor.

## 2018-10-27 NOTE — Progress Notes (Signed)
Sats now 68%; P115; RR15 and very labored.  Patient is sedate per PRN doses of Ativan and Morphine.  Await Morphine gtt per Palliative care orders.

## 2018-10-27 NOTE — Progress Notes (Signed)
Patient awakened and was incontinent of bowel all down his legs.  Attempting to stand by himself - very unsteady.  Patient was cleaned up and able to be transferred to the bed for the first time in over a week.  Patient agreed to go to the bed.  However, approx 5 minutes later, patient is pulling off ventimask and refusing even for staff to hold out in front of him.  Positive for extensive wheezing.  Patient is waving off all attempts of staff to assist.   O2 sats down to 60-70s.  HR 110s.  RN paged Dr. Rowe Pavy from Palliative.  New order received for Ativan 1mg  IVP x one.  Administered.  Patient settled down after Ativan dose given.  Condom cath applied.  Bed alarms on.  Will continue to monitor very closely.  All recent attempts to contact daughter have been unanswered.

## 2018-10-27 NOTE — Progress Notes (Signed)
Patient awakens very agitated; pulls of O2, tele leads; leaning forward as much as possible; significant air hunger exhibited even though prior to waking he was satting mid 90s.  During this agitative episode, patient sats drop to mid 71s.  Palliative updated.  Not time for PRNs.  Verbal orders to medicate w/additional Ativan and Morphine.  Palliative has also talked w/daughter and is ordering Morphine gtt.  Patient is full comfort care at this point per Palliative.  RN remains at bedside to ensure patient does not pass alone.  Will continue to monitor very closely.

## 2018-10-27 NOTE — Progress Notes (Signed)
PT Cancellation Note  Patient Details Name: [name] MRN: [id number] DOB: May 12, 1950   Cancelled Treatment:    Reason Eval/Treat Not Completed: Other (comment) Per nursing, Patient with difficulty sleeping - recently receiving dose of Ativan with patient now resting. Nursing request to hold PT today. Will continue to follow.    Lanney Gins, PT, DPT Supplemental Physical Therapist 10/07/2018 9:55 AM Pager: [phone] Office: [phone]

## 2018-10-27 NOTE — Progress Notes (Signed)
Patient's daughter, Lyle Niblett, returned VM.  RN notified her of patient's death.  Daughter does not want to come into the hospital at this time.  She states she will pick up his belongings tomorrow.

## 2018-10-27 NOTE — Progress Notes (Addendum)
PROGRESS NOTE  Anthony Dorsey MEQ:[id number] DOB: [birth year] DOA: 09/04/2018 PCP: Patient, No Pcp Per  HPI/Recap of past 24 hours: 68 y.o.malewith medical history significant of tobacco abuse Presented with 1 month of worsening dyspnea, he got tired of feeling like that and came in to ER. Reports wheezing ongoing tobacco abuse 1.5 pack/day. Symptom onset gradual nothing seems to make it better reports productive cough and occasional chest pain while coughing but no chest pain at baseline. No fevers or chills.  Chest x-ray done on 09/20/2018 with concern for right paratracheal mass or adenopathy.  Chest CT recommended but patient declines multiple times. Worsening wheezes now on racemic epi as needed.  Hospital course complicated by worsening respiratory status in the setting of noncompliance with medical management.  Called daughter's phone number in the chart multiple times and unable to reach.  Palliative care team following. Per PCCM note patient has agreed to transition to full palliation and hospice care. Will call daughter again to make aware and confirm comfort care.  10-12-18: Patient seen and examined.  He is sitting in his chair on nonrebreather.  States his breathing is about the same.  Conversational dyspnea noted.  Symptom management per palliative care team.  Highly appreciated.  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 Possible residential hospice if agreeable Poor prognosis at this point, likely days.  COPD with acute exacerbation and acute hypoxemic respiratory failure (HCC) -  -Persistent audible wheezing on exam with conversational dyspnea -Completed 5 days of Rocephin and azithro - Continue with Xopenex every 6 hours with every 2 hours as needed Duoneb - C/w solumedrol 60 mg twice daily and Dulera -Has declined CT chest with contrast.   -Currently on nonrebreather to maintain O2 saturation greater than 90%  Persistent acute hypoxic respiratory failure likely multifactorial secondary to suspected hilar mass versus COPD exacerbation  Currently on nonrebreather  Declines ABG or BIPAP COVID-19 negative Continue IV morphine for air hunger Pt discussed w PCCM on 09/24/18 and agreed to transition to palliative/hospice care.  Leukocytosis Suspect reactive from IV steroids Urine culture no growth Procalcitonin negative Chest x-ray no evidence of acute cardiopulmonary disease We will hold off repeating labs since primary goal is for comfort measures  Medical noncompliance Patient is alert and oriented x3 Noncompliant with medical management  Tobacco abuse - -Tobacco cessation counseling at bedside -Continue with nicotine patch as tolerated  Worsening hypercalcemia -Calcium 11.4 from 10.7  -Was on IV fluid  Community-acquired pneumonia  -Patient had refused CT chest thus far Intermittent hemoptysis reported Completed 5 days of Rocephin and azithromycin Procalcitonin and lactic acid done on 09/24/2018 unremarkable. IV antibiotics DC'd.  Resolved prolonged QT interval  -will monitor on tele avoid QT prolonging medications, rehydrate correct electrolytes as needed -Most recent QTc of  411 on recent EKG  Alcohol abuse with concern for  withdrawal - Completed CIWA protocol - Without evidence of withdrawal at this time  BLE edema -BLE edema noted on exam -Denies tenderness on palpation -2d echo done on 09/21/2018 shows normal LVEF  Ambulatory dysfunction/physical debility Has declined occupational therapy while inpatient  Severe protein calorie malnutrition BMI 15 Encourage increase protein oral calorie intake Oral supplement  Unstageable pressure injury to left trochanter. Present on admission.  Wound type:unstageable pressure injury  Pressure Injury POA: Yes  Measurement: 3 cm x 3 cm unable to view wound bed due to the presence of eschar  Wound bed:25% pale pink and 75% soft gray slough  Drainage (amount, consistency, odor) minimal serosanguinous No odor  Periwound:blanchable erythema  Dressing procedure/placement/frequency:Cleanse left hip wound with NS and pat dry. Apply Santyl to wound bed. Cover with NS moist 2x2. Cover with foam dressing. Change daily.  Goals of care:  Palliative care following.   Unable to reach daughter.   Poor prognosis Symptom management per palliative care team Primary goal at this point is comfort measures as recommended by palliative care team.   We will hold off labs.   DVT prophylaxis: SCD's/Lovenox Code Status: DNR Family Communication:  Unable to reach daughter despite multiple attempts.  Disposition Plan:  Poor prognosis; possibly days left.  Consultants:   Palliative care team  Pulmonology  Procedures:    None   Objective: Vitals:   10/19/2018 0753 10-19-2018 0758 [birth year] 0807 2018-10-19 0933  BP:   117/72 109/70  Pulse:   (!) 117 (!) 103  Resp:   (!) 21 (!) 22  Temp:    97.6 F (36.4 C)  TempSrc:    Axillary  SpO2: 100% 100% 99% 100%  Weight:      Height:        Intake/Output Summary (Last 24 hours) at 06/66/2020 1111 Last data filed at 10-19-2018 0800 Gross per 24 hour  Intake 540 ml  Output 600 ml  Net -60 ml   Filed Weights   09/06/2018  1245 09/18/18 0453 09/24/18 0120  Weight: 63.5 kg 47.8 kg 46.1 kg    Exam:  . General: 68 y.o. year-old male cachectic appears uncomfortable with conversational dyspnea. . Cardiovascular: Regular rate and rhythm with no rubs or gallops.  Left JVD.  Marland Kitchen Respiratory: Diffuse rhonchorous sounds bilaterally with poor inspiratory effort.   . Abdomen: Soft nontender nondistended with normal bowel sounds x4 quadrants.   . Musculoskeletal: Trace edema lower extremities bilaterally. . Skin: Dry skin noted affecting lower extremities bilaterally.   Marland Kitchen Psychiatry: Mood is irritable.   Data Reviewed: CBC: Recent Labs  Lab 09/20/18 1410 09/22/18 0227 09/24/18 0419  WBC 9.0 12.8* 18.7*  NEUTROABS  --   --  16.8*  HGB 10.9* 10.8* 11.0*  HCT 34.4* 34.5* 34.0*  MCV 93.2 93.0 92.9  PLT 477* 488* 176*   Basic Metabolic Panel: Recent Labs  Lab 09/20/18 1410 09/22/18 0227 09/24/18 0419  NA 135 141 140  K 3.9 3.9 4.2  CL 96* 96* 95*  CO2 33* 33* 36*  GLUCOSE 124* 138* 111*  BUN 11 21 21   CREATININE 0.50* 0.59* 0.54*  CALCIUM 10.3 10.7* 11.4*  MG  --   --  2.1  PHOS  --   --  4.0   GFR: Estimated Creatinine Clearance: 60 mL/min (A) (by C-G formula based on SCr of 0.54 mg/dL (L)). Liver Function Tests: Recent Labs  Lab 09/24/18 0419  AST 40  ALT 72*  ALKPHOS 89  BILITOT 0.1*  PROT 6.6  ALBUMIN 3.0*   No results for input(s): LIPASE, AMYLASE in the last 168 hours. No results for input(s): AMMONIA in the last 168 hours. Coagulation Profile: No results for input(s): INR, PROTIME in the last 168 hours. Cardiac Enzymes: No results for input(s): CKTOTAL, CKMB, CKMBINDEX, TROPONINI in the last 168 hours. BNP (last 3 results) No results for input(s): PROBNP in the last 8760 hours. HbA1C: No results for input(s): HGBA1C in the last 72 hours. CBG: Recent Labs  Lab 09/23/18 1111  GLUCAP 111*   Lipid Profile: No results for input(s): CHOL, HDL, LDLCALC, TRIG, CHOLHDL, LDLDIRECT  in the last 72 hours. Thyroid Function Tests: No results for input(s): TSH, T4TOTAL, FREET4, T3FREE, THYROIDAB in the last 72 hours. Anemia Panel: No results for input(s): VITAMINB12, FOLATE, FERRITIN, TIBC, IRON, RETICCTPCT in the last 72 hours. Urine analysis: No results found for: COLORURINE, APPEARANCEUR, LABSPEC, PHURINE, GLUCOSEU, HGBUR, BILIRUBINUR, KETONESUR, PROTEINUR, UROBILINOGEN, NITRITE, LEUKOCYTESUR Sepsis Labs: @LABRCNTIP (procalcitonin:4,lacticidven:4)  ) Recent Results (from the past 240 hour(s))  SARS Coronavirus 2 (CEPHEID- Performed in [place] hospital lab), Hosp Order     Status: None   Collection Time: 09/20/2018  1:17 PM  Result Value Ref Range Status   SARS Coronavirus 2 NEGATIVE NEGATIVE Final    Comment: (NOTE) If result is NEGATIVE SARS-CoV-2 target nucleic acids are NOT DETECTED. The SARS-CoV-2 RNA is generally detectable in upper and lower  respiratory specimens during the acute phase of infection. The lowest  concentration of SARS-CoV-2 viral copies this assay can detect is 250  copies / mL. A negative result does not preclude SARS-CoV-2 infection  and should not be used as the sole basis for treatment or other  patient management decisions.  A negative result may occur with  improper specimen collection / handling, submission of specimen other  than nasopharyngeal swab, presence of viral mutation(s) within the  areas targeted by this assay, and inadequate number of viral copies  (<250 copies / mL). A negative result must be combined with clinical  observations, patient history, and epidemiological information. If result is POSITIVE SARS-CoV-2 target nucleic acids are DETECTED. The SARS-CoV-2 RNA is generally detectable in upper and lower  respiratory specimens dur ing the acute phase of infection.  Positive  results are indicative of active infection with SARS-CoV-2.  Clinical  correlation with patient history and other diagnostic information is   necessary to determine patient infection status.  Positive results do  not rule out bacterial infection or co-infection with other viruses. If result is PRESUMPTIVE POSTIVE SARS-CoV-2 nucleic acids MAY BE PRESENT.   A presumptive positive result was obtained on the submitted specimen  and confirmed on repeat testing.  While 2019 novel coronavirus  (SARS-CoV-2) nucleic acids may be present in the submitted sample  additional confirmatory testing may be necessary for epidemiological  and / or clinical management purposes  to differentiate between  SARS-CoV-2 and other Sarbecovirus currently known to infect humans.  If clinically indicated additional testing with an alternate test  methodology [phone]) is advised. The SARS-CoV-2 RNA is generally  detectable in upper and lower respiratory sp ecimens during the acute  phase of infection. The expected result is Negative. Fact Sheet for Patients:  StrictlyIdeas.no Fact Sheet for Healthcare Providers: BankingDealers.co.za This test is not yet approved or cleared by the [place] FDA and has been authorized for detection and/or diagnosis of SARS-CoV-2 by FDA under an Emergency Use Authorization (EUA).  This  EUA will remain in effect (meaning this test can be used) for the duration of the COVID-19 declaration under Section 564(b)(1) of the Act, 21 U.S.C. section 360bbb-3(b)(1), unless the authorization is terminated or revoked sooner. Performed at [facility], [place] [address], [place], [place] [address]   MRSA PCR Screening     Status: None   Collection Time: 09/05/2018 10:31 PM  Result Value Ref Range Status   MRSA by PCR NEGATIVE NEGATIVE Final    Comment:        The GeneXpert MRSA Assay (FDA approved for NASAL specimens only), is one component of a comprehensive MRSA colonization surveillance program. It is not intended to diagnose MRSA infection nor to guide or monitor  treatment for MRSA infections. Performed at [facility], [place] [address] [place], [place] [address]   Culture, Urine     Status: None   Collection Time: 09/20/18 10:09 PM  Result Value Ref Range Status   Specimen Description URINE, RANDOM  Final   Special Requests NONE  Final   Culture   Final    NO GROWTH Performed at [facility], [place] [address], [place], [place] [address]    Report Status 09/21/2018 FINAL  Final      Studies: No results found.  Scheduled Meds: . collagenase   Topical Daily  . enoxaparin (LOVENOX) injection  40 mg Subcutaneous Q24H  . feeding supplement (ENSURE ENLIVE)  237 mL Oral TID BM  . folic acid  1 mg Oral Daily  . ipratropium  0.5 mg Nebulization Q6H  . levalbuterol  0.63 mg Nebulization Q6H  . methylPREDNISolone (SOLU-MEDROL) injection  60 mg Intravenous Q12H  . mometasone-formoterol  1 puff Inhalation BID  .  morphine injection  1 mg Intravenous Once  . multivitamin with minerals  1 tablet Oral Daily  . sodium chloride flush  10-40 mL Intracatheter Q12H  . sodium chloride flush  3 mL Intravenous Q12H  . thiamine  100 mg Oral Daily    Continuous Infusions:    LOS: 10 days     Kayleen Memos, MD [facility] Pager [phone]  If 7PM-7AM, please contact night-coverage [email or web address] Password [email or web address] Oct 19, 2018, 11:11 AM

## 2018-10-27 NOTE — Progress Notes (Signed)
Patient was demanding he be given Ambien to help him sleep.  Dr. Nevada Crane was recently in patient room and told patient she would order Ambien; however, Ambien is only ordered at bedtime.  Patient hits his fist on the table and demands the doctor come right back to his room.  RN able to calm patient by offering PRN Ativan IVP per order.  Ativan given.  Patient pillows placed on bedside table as patient refuses to get in the bed; prefers to sleep with his head leaned over on the bedside table.  Warm blankets provided.  Patient on NRB.  Will continue to monitor.

## 2018-10-27 NOTE — Progress Notes (Signed)
PMT no charge note  Call placed, discussed with daughter Trelon Plush at [phone]  Discussed about patient's current condition and hospital course. Shared with her that the patient likely has lung cancer and is likely at end of life. Discussed with her about comfort care measures. She was asking about sending the patient to hospice, frankly shared my concern with her that the patient appears to be soon approaching end of life and we want to keep him comfortable here at the hospital. She is aware. I gave the patient's daughter information about how she can come visit him in the hospital. All of her questions addressed to the best of my ability.   Will start Morphine drip,continue other prn medications as well. Prognosis appears markedly limited, appreciate RN Ms Poling's efforts and excellent care of the patient.   15 minutes spent.  Loistine Chance MD [facility] health palliative medicine team [phone]

## 2018-10-27 NOTE — Care Management Important Message (Signed)
Important Message  Patient Details  Name: Anthony Dorsey MRN: [id number] Date of Birth: 03-12-1951   Medicare Important Message Given:  Yes    Orbie Pyo October 10, 2018, 3:37 PM

## 2018-10-27 NOTE — Death Summary Note (Signed)
Death Summary  Anthony Dorsey XTA:[id number] DOB: Dec 13, 1950 DOA: 2018/09/20  PCP: Patient, No Pcp Per  Admit date: 09-20-2018 Date of Death: 09-30-2018 Time of Death: 07/08/05 Notification: Patient, No Pcp Per notified of death of 09-30-2018   History of present illness:  Anthony Dorsey is a 68 y.o. male with a history of tobacco use disorder, 1.5 pack/day, COPD who presented with 1 month of worsening dyspnea, he became tired of feeling this way and came to the ER. Symptom onset was gradual nothing seems to make it better. Reports productive cough, occasional hemoptysis and chest pain while coughing but no chest pain at baseline. No fevers or chills.  Chest x-ray done on 09/20/2018 with concern for right paratracheal mass or adenopathy. Chest CT recommended but patient declines multiple times by several providers. High suspicion for malignancy. Palliative care team followed. Patient wants full palliation and hospice care.   On 30-Sep-2018 patient expired at 1507.  Final Diagnoses:  1.   Cardiopulmonary arrest. 2.   Hilar lung mass with high suspicion for malignancy. 3.   Respiratory failure.   The results of significant diagnostics from this hospitalization (including imaging, microbiology, ancillary and laboratory) are listed below for reference.    Significant Diagnostic Studies: Dg Chest 2 View  Result Date: Sep 20, 2018 CLINICAL DATA:  Wheezing and shortness of breath EXAM: CHEST - 2 VIEW COMPARISON:  None. FINDINGS: The heart size and mediastinal contours are within normal limits. Emphysema. There is paramedian opacity and/or consolidation of the medial right upper lobe. Disc degenerative disease of the thoracic spine. IMPRESSION: 1. There is paramedian opacity and/or consolidation of the medial right upper lobe, concerning for mass or consolidation. Recommend CT to further evaluate. 2.  Emphysema. Electronically Signed   By: Eddie Candle M.D.   On: Sep 20, 2018 13:51   Dg Chest Port 1  View  Result Date: 09/25/2018 CLINICAL DATA:  Respiratory failure EXAM: PORTABLE CHEST 1 VIEW COMPARISON:  09/24/2018 FINDINGS: Apical lordotic view. Stable soft tissue in the right suprahilar region. Lungs otherwise clear. No pleural effusion or pneumothorax. The heart is normal in size. IMPRESSION: No evidence of acute cardiopulmonary disease. Stable soft tissue in the right suprahilar region. As previously noted, consider CT chest with contrast for further evaluation if clinically warranted. Electronically Signed   By: Julian Hy M.D.   On: 09/25/2018 08:44   Dg Chest Port 1 View  Result Date: 09/24/2018 CLINICAL DATA:  Shortness of breath and wheezing EXAM: PORTABLE CHEST 1 VIEW COMPARISON:  Sep 20, 2018 FINDINGS: There is soft tissue prominence in the right paratracheal region, concerning for adenopathy. There is mild right base atelectasis. No edema or consolidation is evident. There is an azygos lobe on the right, an anatomic variant. Heart size and pulmonary vascular normal.  No bone lesions evident. IMPRESSION: Persistent soft tissue prominence in the right paratracheal region. This appearance is concerning for adenopathy. Contrast enhanced chest CT advised to further evaluate. Elsewhere, no edema or consolidation is evident. There is slight right base atelectasis. Heart size is normal. These results will be called to the ordering clinician or representative by the Radiologist Assistant, and communication documented in the PACS or zVision Dashboard. Electronically Signed   By: Lowella Grip III M.D.   On: 09/24/2018 07:57   Dg Chest Port 1 View  Result Date: 09/20/2018 CLINICAL DATA:  Hypoxia with difficulty breathing and wheezing. History of COPD and lung mass. EXAM: PORTABLE CHEST 1 VIEW COMPARISON:  Two-view chest 09/20/18. No other comparison  studies. FINDINGS: 1640 hours. The heart size is normal. There is persistent right paratracheal soft tissue thickening suspicious for a  mass. The trachea is mildly deviated to the left. Emphysematous changes are present with right apical blebs. No peripheral lung mass, consolidation, pleural effusion or pneumothorax. The bones appear unchanged. Telemetry leads overlie the chest. IMPRESSION: Stable appearance of the chest with persistent concern of a right paratracheal mass or adenopathy. Chest CT recommended to evaluate for possible malignancy. Electronically Signed   By: Richardean Sale M.D.   On: 09/20/2018 17:13   [place] Ekg Site Rite  Result Date: 09/18/2018 If Site Rite image not attached, placement could not be confirmed due to current cardiac rhythm.   Microbiology: Recent Results (from the past 240 hour(s))  MRSA PCR Screening     Status: None   Collection Time: 08/28/2018 10:31 PM  Result Value Ref Range Status   MRSA by PCR NEGATIVE NEGATIVE Final    Comment:        The GeneXpert MRSA Assay (FDA approved for NASAL specimens only), is one component of a comprehensive MRSA colonization surveillance program. It is not intended to diagnose MRSA infection nor to guide or monitor treatment for MRSA infections. Performed at [facility], [place] [address] [place], [place] [address]   Culture, Urine     Status: None   Collection Time: 09/20/18 10:09 PM  Result Value Ref Range Status   Specimen Description URINE, RANDOM  Final   Special Requests NONE  Final   Culture   Final    NO GROWTH Performed at [facility], [place] [address], [place], [place] [address]    Report Status 09/21/2018 FINAL  Final     Labs: Basic Metabolic Panel: Recent Labs  Lab 09/22/18 0227 09/24/18 0419  NA 141 140  K 3.9 4.2  CL 96* 95*  CO2 33* 36*  GLUCOSE 138* 111*  BUN 21 21  CREATININE 0.59* 0.54*  CALCIUM 10.7* 11.4*  MG  --  2.1  PHOS  --  4.0   Liver Function Tests: Recent Labs  Lab 09/24/18 0419  AST 40  ALT 72*  ALKPHOS 89  BILITOT 0.1*  PROT 6.6  ALBUMIN 3.0*   No results for input(s): LIPASE,  AMYLASE in the last 168 hours. No results for input(s): AMMONIA in the last 168 hours. CBC: Recent Labs  Lab 09/22/18 0227 09/24/18 0419  WBC 12.8* 18.7*  NEUTROABS  --  16.8*  HGB 10.8* 11.0*  HCT 34.5* 34.0*  MCV 93.0 92.9  PLT 488* 486*   Cardiac Enzymes: No results for input(s): CKTOTAL, CKMB, CKMBINDEX, TROPONINI in the last 168 hours. D-Dimer No results for input(s): DDIMER in the last 72 hours. BNP: Invalid input(s): POCBNP CBG: Recent Labs  Lab 09/23/18 1111  GLUCAP 111*   Anemia work up No results for input(s): VITAMINB12, FOLATE, FERRITIN, TIBC, IRON, RETICCTPCT in the last 72 hours. Urinalysis No results found for: COLORURINE, APPEARANCEUR, LABSPEC, [place], GLUCOSEU, HGBUR, BILIRUBINUR, KETONESUR, PROTEINUR, UROBILINOGEN, NITRITE, LEUKOCYTESUR Sepsis Labs Invalid input(s): PROCALCITONIN,  WBC,  LACTICIDVEN     SIGNED:  Kayleen Memos, MD  [facility] 2018/10/18, 4:06 PM Pager   If 7PM-7AM, please contact night-coverage [email or web address] Password [email or web address]

## 2018-10-27 NOTE — Progress Notes (Signed)
Patient without respirations x 7 minutes; however PEA on monitor w/rate 40.  Patient death confirmed per two RNs via protocol - Earnestine Leys, RN and Fabienne Bruns, RN.  Physician to be notified.

## 2018-10-27 NOTE — Progress Notes (Signed)
Patient rests easy only with Morphine and Ativan administrations.  When awake, he is terribly agitated and expresses extreme air hunger, inability to breathe.  Multiple attempts have been made by physicians and nursing throughout the weekend to contact daughter.  A second number for daughter was located and RN spoke w/Tyana Luckey, daughter.  She can be contacted at [phone].  Message to be relayed to Palliative care physician, Dr. Rowe Pavy, for update on patient's very guarded prognosis and comfort care measures.

## 2018-10-27 DEATH — deceased

## 2019-08-21 IMAGING — CR PORTABLE CHEST - 1 VIEW
1 series · 1 of 1 positions shown · non-contrast
Comparison: September 20, 2018

CLINICAL DATA: Shortness of breath and wheezing

EXAM:
PORTABLE CHEST 1 VIEW

[AP]
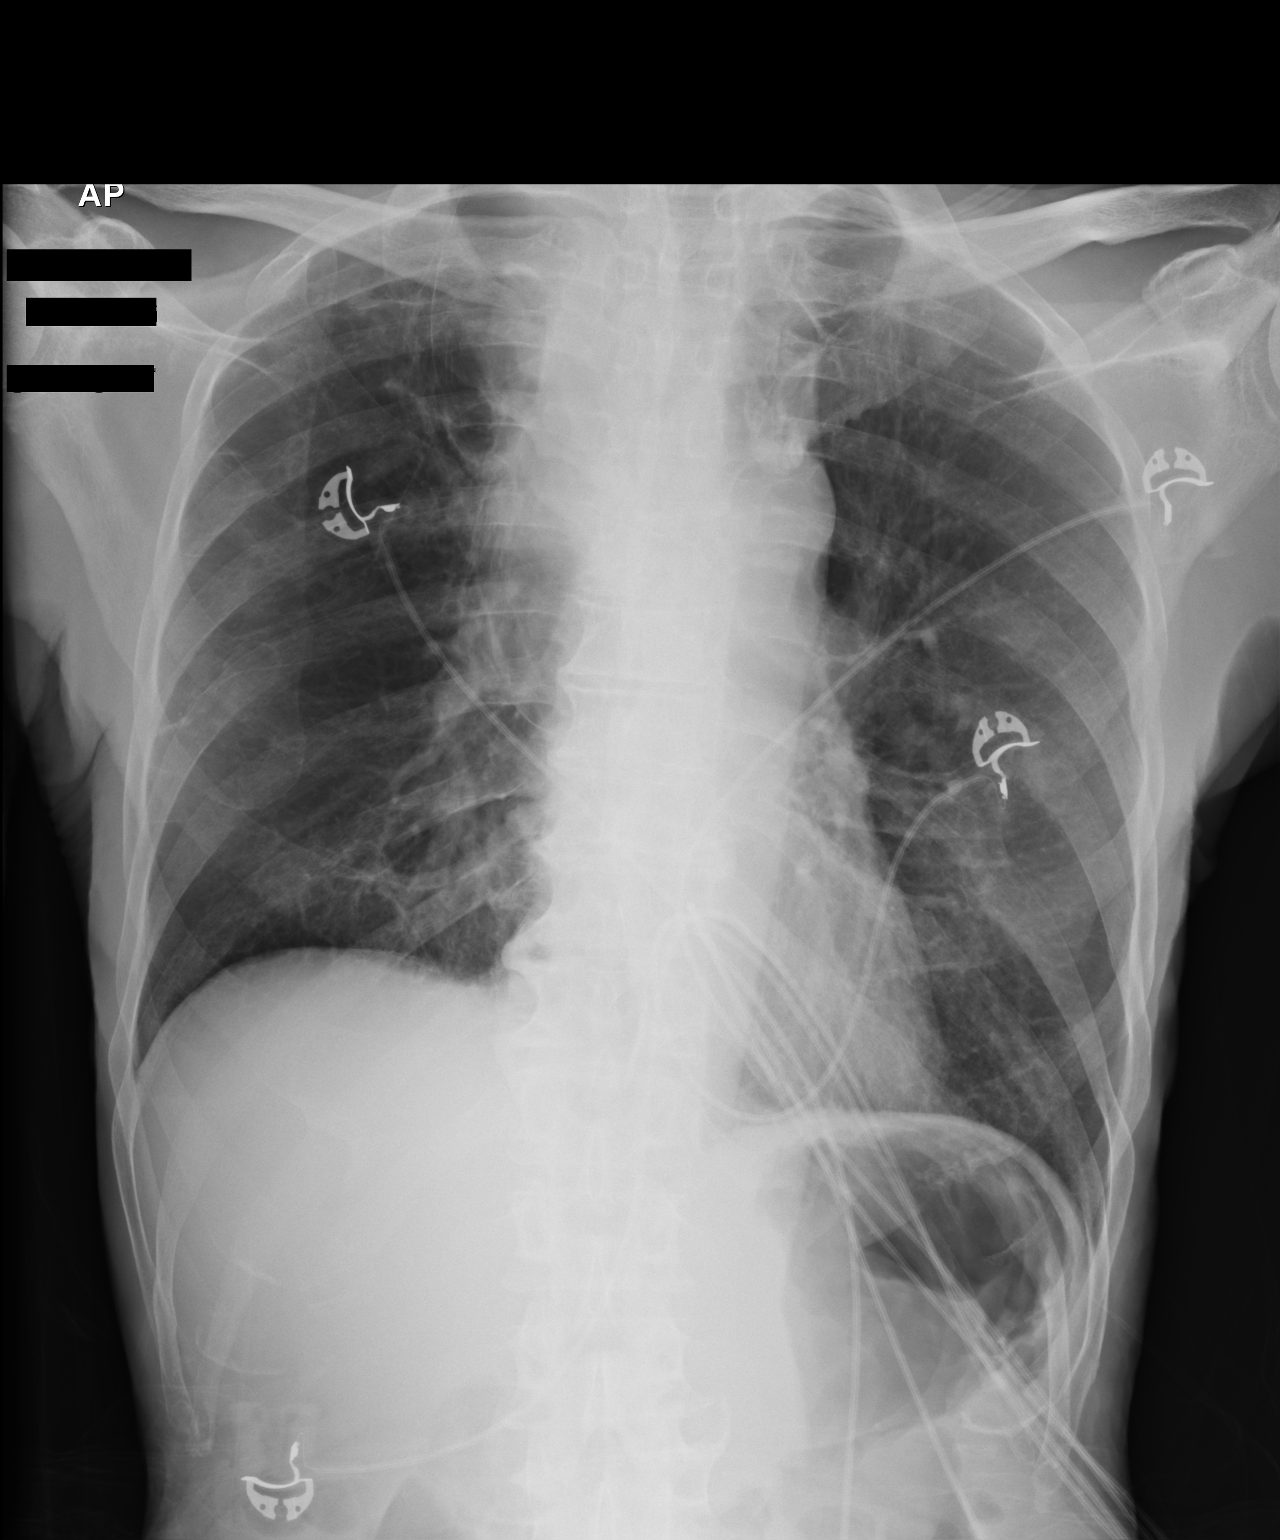

[1 of 1 positions shown; findings below may reference images not displayed]

FINDINGS: There is soft tissue prominence in the right paratracheal region,
concerning for adenopathy. There is mild right base atelectasis. No
edema or consolidation is evident. There is an azygos lobe on the
right, an anatomic variant.

Heart size and pulmonary vascular normal.  No bone lesions evident.
IMPRESSION: Persistent soft tissue prominence in the right paratracheal region.
This appearance is concerning for adenopathy. Contrast enhanced
chest CT advised to further evaluate.

Elsewhere, no edema or consolidation is evident. There is slight
right base atelectasis. Heart size is normal.

These results will be called to the ordering clinician or
representative by the Radiologist Assistant, and communication
documented in the PACS or zVision Dashboard.

## 2019-08-22 IMAGING — DX PORTABLE CHEST - 1 VIEW
1 series · 1 of 1 positions shown · non-contrast
Comparison: 09/24/2018

CLINICAL DATA: Respiratory failure

EXAM:
PORTABLE CHEST 1 VIEW

[chest ap]
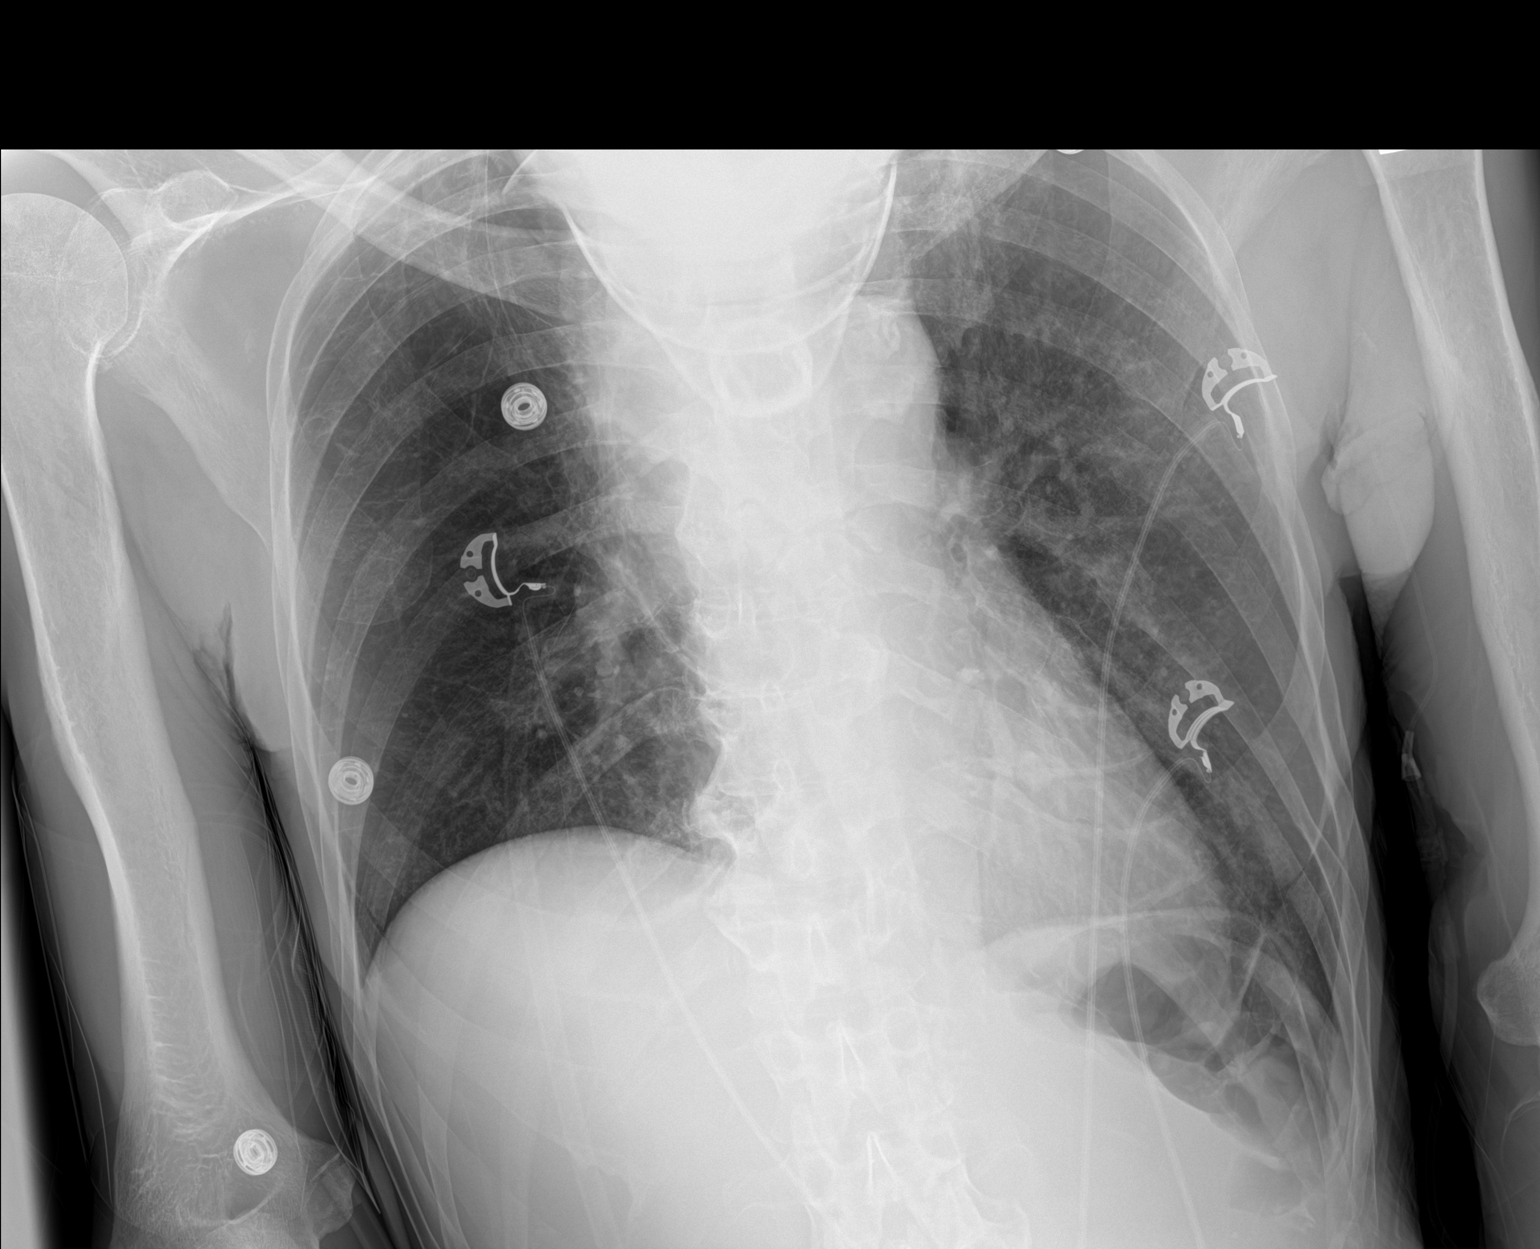

[1 of 1 positions shown; findings below may reference images not displayed]

FINDINGS: Apical lordotic view.

Stable soft tissue in the right suprahilar region. Lungs otherwise
clear. No pleural effusion or pneumothorax.

The heart is normal in size.
IMPRESSION: No evidence of acute cardiopulmonary disease.

Stable soft tissue in the right suprahilar region. As previously
noted, consider CT chest with contrast for further evaluation if
clinically warranted.
